# Patient Record
Sex: Male | Born: 1985 | Race: White | Hispanic: No | Marital: Single | Smoking: Never smoker
Health system: Southern US, Community
[De-identification: ages and names within clinical notes are randomized; demographics above are authoritative.]

## PROBLEM LIST (undated history)

## (undated) DIAGNOSIS — F988 Other specified behavioral and emotional disorders with onset usually occurring in childhood and adolescence: Secondary | ICD-10-CM

## (undated) DIAGNOSIS — K219 Gastro-esophageal reflux disease without esophagitis: Secondary | ICD-10-CM

## (undated) DIAGNOSIS — F419 Anxiety disorder, unspecified: Secondary | ICD-10-CM

## (undated) DIAGNOSIS — I10 Essential (primary) hypertension: Secondary | ICD-10-CM

## (undated) HISTORY — DX: Essential (primary) hypertension: I10

## (undated) HISTORY — DX: Gastro-esophageal reflux disease without esophagitis: K21.9

## (undated) HISTORY — DX: Other specified behavioral and emotional disorders with onset usually occurring in childhood and adolescence: F98.8

## (undated) HISTORY — DX: Anxiety disorder, unspecified: F41.9

---
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
## 2012-05-19 ENCOUNTER — Telehealth: Payer: Self-pay | Admitting: Family Medicine

## 2012-05-19 MED ORDER — AMPHETAMINE-DEXTROAMPHET ER 30 MG PO CP24: 30.0000 mg | ORAL_CAPSULE | ORAL | Status: DC

## 2012-05-19 NOTE — Telephone Encounter (Signed)
?   OK to Refill  

## 2012-05-19 NOTE — Telephone Encounter (Signed)
Pt aware to pick up via voicemail °

## 2012-05-19 NOTE — Telephone Encounter (Signed)
Ok to pick up, due for ov.

## 2012-07-01 ENCOUNTER — Telehealth: Payer: Self-pay | Admitting: Family Medicine

## 2012-07-01 NOTE — Telephone Encounter (Signed)
Ok to refill 

## 2012-07-01 NOTE — Telephone Encounter (Signed)
?   OK to Refill  

## 2012-07-02 MED ORDER — AMPHETAMINE-DEXTROAMPHET ER 30 MG PO CP24: 30.0000 mg | ORAL_CAPSULE | ORAL | Status: DC

## 2012-07-02 NOTE — Telephone Encounter (Signed)
..  Rx Refilled and pt aware per vm to pick up rx

## 2012-07-29 ENCOUNTER — Telehealth: Payer: Self-pay | Admitting: Family Medicine

## 2012-07-29 MED ORDER — AMPHETAMINE-DEXTROAMPHET ER 30 MG PO CP24: 30.0000 mg | ORAL_CAPSULE | ORAL | Status: DC

## 2012-07-29 NOTE — Telephone Encounter (Signed)
Per [facility] ok to fill

## 2012-09-01 ENCOUNTER — Telehealth: Payer: Self-pay | Admitting: Family Medicine

## 2012-09-01 NOTE — Telephone Encounter (Signed)
Ok to refill 

## 2012-09-01 NOTE — Telephone Encounter (Signed)
?   OK to Refill  

## 2012-09-02 MED ORDER — AMPHETAMINE-DEXTROAMPHET ER 30 MG PO CP24: 30.0000 mg | ORAL_CAPSULE | ORAL | Status: DC

## 2012-09-02 NOTE — Telephone Encounter (Signed)
rx printed, left up front and pt aware per vm 

## 2012-10-13 ENCOUNTER — Telehealth: Payer: Self-pay | Admitting: Family Medicine

## 2012-10-13 MED ORDER — AMPHETAMINE-DEXTROAMPHET ER 30 MG PO CP24: 30.0000 mg | ORAL_CAPSULE | ORAL | Status: DC

## 2012-10-13 NOTE — Telephone Encounter (Signed)
Adderall XR 30 mg 1 QAM #30  FYI: Pt made an appt for med check for next Tuesday morning.

## 2012-10-13 NOTE — Telephone Encounter (Signed)
?   OK to Refill  

## 2012-10-13 NOTE — Telephone Encounter (Signed)
.  RX printed, left up front and patient aware per vm to pick up

## 2012-10-13 NOTE — Telephone Encounter (Signed)
ok 

## 2012-10-20 ENCOUNTER — Ambulatory Visit: Payer: Self-pay | Admitting: Family Medicine

## 2012-10-27 ENCOUNTER — Encounter: Payer: Self-pay | Admitting: Family Medicine

## 2012-10-27 ENCOUNTER — Ambulatory Visit (INDEPENDENT_AMBULATORY_CARE_PROVIDER_SITE_OTHER): Payer: Managed Care, Other (non HMO) | Admitting: Family Medicine

## 2012-10-27 VITALS — BP 142/78 | HR 74 | Temp 97.9°F | Resp 12 | Ht 72.0 in | Wt 183.0 lb

## 2012-10-27 DIAGNOSIS — F418 Other specified anxiety disorders: Secondary | ICD-10-CM

## 2012-10-27 DIAGNOSIS — F411 Generalized anxiety disorder: Secondary | ICD-10-CM

## 2012-10-27 DIAGNOSIS — F988 Other specified behavioral and emotional disorders with onset usually occurring in childhood and adolescence: Secondary | ICD-10-CM | POA: Insufficient documentation

## 2012-10-27 MED ORDER — AMPHETAMINE-DEXTROAMPHET ER 20 MG PO CP24: 20.0000 mg | ORAL_CAPSULE | Freq: Two times a day (BID) | ORAL | Status: DC

## 2012-10-27 MED ORDER — PROPRANOLOL HCL 20 MG PO TABS: 20.0000 mg | ORAL_TABLET | Freq: Three times a day (TID) | ORAL | Status: DC

## 2012-10-27 NOTE — Progress Notes (Signed)
  Subjective:    Patient ID: Zachary Hopkins, male    DOB: 03-13-1985, 27 y.o.   MRN: [id number]  HPI Patient is a very pleasant 27 year old white male who has a history of ADD. He is currently on Adderall XR 30 mg by mouth every morning. He also uses propranolol 20 mg by mouth x1 as needed for situational anxiety. The patient is currently in law school. Whenever he has to speak in public or prior to test he becomes very anxious. He uses propranolol to help slow his heart rate and calm his anxiety. This works very well for his anxiety. He requests a refill on this medication. He graduates from [employer] in May. Unfortunately he is studying now for his boards. 30 mg of Adderall does not seem to last long enough. He takes it  At 6:30 AM. It wears off 1 or 2 PM.  This does not allow him to study throughout the day. He is interested in possible change in medication to help him study. 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 Past Medical History  Diagnosis Date  . ADD (attention deficit disorder)   . Anxiety    No current outpatient prescriptions on file prior to visit.   No current facility-administered medications on file prior to visit.   No Known Allergies History   Social History  . Marital Status: Single    Spouse Name: N/A    Number of Children: N/A  . Years of Education: N/A   Occupational History  . Not on file.   Social History Main Topics  . Smoking status: Never Smoker   . Smokeless tobacco: Not on file  . Alcohol Use: Yes  . Drug Use: No  . Sexual Activity: Not on file   Other Topics Concern  . Not on file   Social History Narrative  . No narrative on file     Review of Systems  All other systems reviewed and are negative.       Objective:   Physical Exam  Vitals reviewed. Constitutional: He is oriented to person, place, and time. He appears well-developed and well-nourished.  Neck:  Neck supple. No JVD present. No thyromegaly present.  Cardiovascular: Normal rate, regular rhythm and normal heart sounds.   No murmur heard. Pulmonary/Chest: Effort normal and breath sounds normal. No respiratory distress. He has no wheezes. He has no rales.  Abdominal: Soft. Bowel sounds are normal. He exhibits no distension. There is no tenderness. There is no rebound.  Musculoskeletal: He exhibits no edema.  Neurological: He is alert and oriented to person, place, and time. He displays normal reflexes. No cranial nerve deficit. He exhibits normal muscle tone. Coordination normal.  Psychiatric: He has a normal mood and affect. His behavior is normal. Judgment and thought content normal.          Assessment & Plan:  1. ADD (attention deficit disorder) Change medication to Adderall XR 20 mg by mouth twice a day.  Once he passes exam, I would wean back to 20 mg daily as needed  2. Situational anxiety Continue propranolol 20 mg prn.  His blood pressure is elevated today. I rechecked it after he calmed down in the room and it was 122/80.  I asked the patient check his blood pressure frequently at home. If it is consistently elevated I would switch the patient to Inderal LA 60 mg by mouth daily for hypertension.

## 2013-02-01 ENCOUNTER — Telehealth: Payer: Self-pay | Admitting: Family Medicine

## 2013-02-01 MED ORDER — AMPHETAMINE-DEXTROAMPHET ER 20 MG PO CP24: 20.0000 mg | ORAL_CAPSULE | Freq: Two times a day (BID) | ORAL | Status: DC

## 2013-02-01 MED ORDER — PROPRANOLOL HCL 20 MG PO TABS: 20.0000 mg | ORAL_TABLET | Freq: Three times a day (TID) | ORAL | Status: DC

## 2013-02-01 NOTE — Telephone Encounter (Signed)
RX printed, left up front and patient aware to pick up and inderal sent to pharm

## 2013-02-01 NOTE — Telephone Encounter (Signed)
Pt is needing adderall and inderal refilled Call back number [phone]

## 2013-02-01 NOTE — Telephone Encounter (Signed)
ok 

## 2013-02-01 NOTE — Telephone Encounter (Signed)
?   OK to Refill  

## 2013-02-02 NOTE — Telephone Encounter (Signed)
ok 

## 2013-03-25 ENCOUNTER — Telehealth: Payer: Self-pay | Admitting: Family Medicine

## 2013-03-25 NOTE — Telephone Encounter (Signed)
PA sent through [email or web address] - [employer]

## 2013-03-29 NOTE — Telephone Encounter (Signed)
Approved from 03/25/13 - 03/25/14 : Faxed approval to pharm.

## 2013-05-13 ENCOUNTER — Telehealth: Payer: Self-pay | Admitting: *Deleted

## 2013-05-13 NOTE — Telephone Encounter (Signed)
Ok to refill??  Last office visit 10/27/2012.  Last refill 01/27/2013.

## 2013-05-13 NOTE — Telephone Encounter (Signed)
Message copied by Mitchell OdorSIX, Felisa Zechman H on Thu May 13, 2013  4:32 PM ------      Message from: Malvin JohnsBULLINS, SUSAN S      Created: Thu May 13, 2013  4:25 PM       Patient requesting refill on his adderall [phone] ------

## 2013-05-14 MED ORDER — AMPHETAMINE-DEXTROAMPHET ER 20 MG PO CP24: 20.0000 mg | ORAL_CAPSULE | Freq: Two times a day (BID) | ORAL | Status: DC

## 2013-05-14 NOTE — Telephone Encounter (Signed)
Prescription printed.   LMTRC.  

## 2013-05-14 NOTE — Telephone Encounter (Signed)
ok 

## 2013-05-14 NOTE — Telephone Encounter (Signed)
Call placed to patient and patient made aware.  

## 2013-06-25 ENCOUNTER — Telehealth: Payer: Self-pay | Admitting: Family Medicine

## 2013-06-25 DIAGNOSIS — F988 Other specified behavioral and emotional disorders with onset usually occurring in childhood and adolescence: Secondary | ICD-10-CM

## 2013-06-25 MED ORDER — AMPHETAMINE-DEXTROAMPHET ER 30 MG PO CP24: 30.0000 mg | ORAL_CAPSULE | ORAL | Status: DC

## 2013-06-25 NOTE — Telephone Encounter (Signed)
Switch to adderall xr 30 mg poqam.  30

## 2013-06-25 NOTE — Telephone Encounter (Signed)
Wants to know if can switch back to Adderall XR 30 mg once daily.  Will be easier for him to take then 20 mg BID.  Also has made appt to come see you next week.

## 2013-06-25 NOTE — Telephone Encounter (Signed)
Patient needs refill on adderall  [phone] °

## 2013-06-25 NOTE — Telephone Encounter (Signed)
Rx printed and pt aware ready for pickup 

## 2013-06-25 NOTE — Telephone Encounter (Signed)
?   OK to Refill  

## 2013-06-29 ENCOUNTER — Ambulatory Visit: Payer: Self-pay | Admitting: Family Medicine

## 2013-08-02 ENCOUNTER — Telehealth: Payer: Self-pay | Admitting: Family Medicine

## 2013-08-02 DIAGNOSIS — F988 Other specified behavioral and emotional disorders with onset usually occurring in childhood and adolescence: Secondary | ICD-10-CM

## 2013-08-02 NOTE — Telephone Encounter (Signed)
?   OK to Refill  

## 2013-08-02 NOTE — Telephone Encounter (Signed)
Patient needs refill on adderall  [phone]

## 2013-08-03 MED ORDER — AMPHETAMINE-DEXTROAMPHET ER 30 MG PO CP24: 30.0000 mg | ORAL_CAPSULE | ORAL | Status: DC

## 2013-08-03 NOTE — Telephone Encounter (Signed)
ok 

## 2013-08-03 NOTE — Telephone Encounter (Signed)
Prescription printed and patient made aware to come to office to pick up.  

## 2013-10-22 ENCOUNTER — Telehealth: Payer: Self-pay | Admitting: Family Medicine

## 2013-10-22 DIAGNOSIS — F988 Other specified behavioral and emotional disorders with onset usually occurring in childhood and adolescence: Secondary | ICD-10-CM

## 2013-10-22 MED ORDER — AMPHETAMINE-DEXTROAMPHET ER 30 MG PO CP24: 30.0000 mg | ORAL_CAPSULE | ORAL | Status: DC

## 2013-10-22 MED ORDER — PROPRANOLOL HCL 20 MG PO TABS: 20.0000 mg | ORAL_TABLET | Freq: Three times a day (TID) | ORAL | Status: DC

## 2013-10-22 NOTE — Telephone Encounter (Signed)
RX printed, left up front and patient aware to pick up  

## 2013-10-22 NOTE — Telephone Encounter (Signed)
[phone]  PT is needing a refill on  amphetamine-dextroamphetamine (ADDERALL XR) 30 MG 24 hr capsule propranolol (INDERAL) 20 MG tablet

## 2013-10-22 NOTE — Telephone Encounter (Signed)
?   OK to Refill  

## 2013-10-22 NOTE — Telephone Encounter (Signed)
ok 

## 2013-11-25 ENCOUNTER — Telehealth: Payer: Self-pay | Admitting: Family Medicine

## 2013-11-25 DIAGNOSIS — F988 Other specified behavioral and emotional disorders with onset usually occurring in childhood and adolescence: Secondary | ICD-10-CM

## 2013-11-25 MED ORDER — AMPHETAMINE-DEXTROAMPHET ER 30 MG PO CP24: 30.0000 mg | ORAL_CAPSULE | ORAL | Status: DC

## 2013-11-25 NOTE — Telephone Encounter (Signed)
ok 

## 2013-11-25 NOTE — Telephone Encounter (Signed)
?   OK to Refill  

## 2013-11-25 NOTE — Telephone Encounter (Signed)
patinet needs refill on adderall  [phone]

## 2013-11-25 NOTE — Telephone Encounter (Signed)
Prescription printed.   LMTRC.  

## 2013-11-26 NOTE — Telephone Encounter (Signed)
Pt picked up rx

## 2014-01-05 ENCOUNTER — Telehealth: Payer: Self-pay | Admitting: Family Medicine

## 2014-01-05 DIAGNOSIS — F988 Other specified behavioral and emotional disorders with onset usually occurring in childhood and adolescence: Secondary | ICD-10-CM

## 2014-01-05 NOTE — Telephone Encounter (Signed)
?   OK to Refill  

## 2014-01-05 NOTE — Telephone Encounter (Signed)
Patient is requesting refill for adderall  [phone]

## 2014-01-06 NOTE — Telephone Encounter (Signed)
[facility] has not been seen in >1 year.  OK with 1 additional refill to allow him to schedule appt.

## 2014-01-07 MED ORDER — AMPHETAMINE-DEXTROAMPHET ER 30 MG PO CP24: 30.0000 mg | ORAL_CAPSULE | ORAL | Status: DC

## 2014-01-07 NOTE — Telephone Encounter (Signed)
RX printed, left up front and patient aware to pick up and appt made 

## 2014-01-17 ENCOUNTER — Ambulatory Visit (INDEPENDENT_AMBULATORY_CARE_PROVIDER_SITE_OTHER): Payer: BC Managed Care – PPO | Admitting: Family Medicine

## 2014-01-17 ENCOUNTER — Encounter: Payer: Self-pay | Admitting: Family Medicine

## 2014-01-17 VITALS — BP 128/72 | HR 60 | Temp 98.1°F | Resp 14 | Ht 72.0 in | Wt 185.0 lb

## 2014-01-17 DIAGNOSIS — F988 Other specified behavioral and emotional disorders with onset usually occurring in childhood and adolescence: Secondary | ICD-10-CM

## 2014-01-17 DIAGNOSIS — F909 Attention-deficit hyperactivity disorder, unspecified type: Secondary | ICD-10-CM

## 2014-01-17 NOTE — Progress Notes (Signed)
   Subjective:    Patient ID: Zachary Hopkins, male    DOB: 1985-05-11, 28 y.o.   MRN: [id number]  HPI  Patient is doing very well on Adderall XR 30 mg by mouth every morning.  He denies any palpitations or chest pain insomnia or weight loss. He uses the medication approximately 3 days a week. 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 He states that the medication typically wears off around 1:00 in the afternoon. However he is not interested in changing the medication at this time  Past Medical History  Diagnosis Date  . ADD (attention deficit disorder)   . Anxiety     takes propranolol priro to public speaking   No past surgical history on file. Current Outpatient Prescriptions on File Prior to Visit  Medication Sig Dispense Refill  . amphetamine-dextroamphetamine (ADDERALL XR) 30 MG 24 hr capsule Take 1 capsule (30 mg total) by mouth every morning. 30 capsule 0  . propranolol (INDERAL) 20 MG tablet Take 1 tablet (20 mg total) by mouth 3 (three) times daily. As needed for public speaking 90 tablet 1   No current facility-administered medications on file prior to visit.   No Known Allergies History   Social History  . Marital Status: Single    Spouse Name: N/A    Number of Children: N/A  . Years of Education: N/A   Occupational History  . Not on file.   Social History Main Topics  . Smoking status: Never Smoker   . Smokeless tobacco: Not on file  . Alcohol Use: Yes  . Drug Use: No  . Sexual Activity: Not on file   Other Topics Concern  . Not on file   Social History Narrative   No family history on file.   Review of Systems  All other systems reviewed and are negative.      Objective:   Physical Exam  Constitutional: He appears well-developed and well-nourished.  Neck: Neck supple. No thyromegaly present.  Cardiovascular: Normal rate, regular rhythm and normal heart sounds.   No murmur heard. Pulmonary/Chest: Effort normal and breath sounds  normal. No respiratory distress. He has no wheezes. He has no rales.  Psychiatric: He has a normal mood and affect. His behavior is normal. Judgment and thought content normal.  Vitals reviewed.         Assessment & Plan:  ADD (attention deficit disorder) continue Adderall XR 30 mg by mouth every morning. Consider supplementing with Strattera in the future.

## 2014-02-11 ENCOUNTER — Telehealth: Payer: Self-pay | Admitting: Family Medicine

## 2014-02-11 DIAGNOSIS — F988 Other specified behavioral and emotional disorders with onset usually occurring in childhood and adolescence: Secondary | ICD-10-CM

## 2014-02-11 NOTE — Telephone Encounter (Signed)
Patient would like rx for his adderall  [phone]

## 2014-02-11 NOTE — Telephone Encounter (Signed)
?   OK to Refill and to do 3 months?? 

## 2014-02-14 MED ORDER — AMPHETAMINE-DEXTROAMPHET ER 30 MG PO CP24: 30.0000 mg | ORAL_CAPSULE | ORAL | Status: DC

## 2014-02-14 NOTE — Telephone Encounter (Signed)
ok 

## 2014-02-14 NOTE — Telephone Encounter (Signed)
RX printed x 3 months, left up front and patient aware to pick up via vm 

## 2014-03-29 ENCOUNTER — Telehealth: Payer: Self-pay | Admitting: *Deleted

## 2014-03-29 NOTE — Telephone Encounter (Signed)
Received request from pharmacy for PA on Adderall.   PA submitted.   Dx: F90.9- ADD

## 2014-06-03 ENCOUNTER — Telehealth: Payer: Self-pay | Admitting: Family Medicine

## 2014-06-03 DIAGNOSIS — F988 Other specified behavioral and emotional disorders with onset usually occurring in childhood and adolescence: Secondary | ICD-10-CM

## 2014-06-03 NOTE — Telephone Encounter (Signed)
[phone] PT is needing a refill on amphetamine-dextroamphetamine (ADDERALL XR) 30 MG 24 hr capsule

## 2014-06-03 NOTE — Telephone Encounter (Signed)
?   OK to Refill  

## 2014-06-06 MED ORDER — AMPHETAMINE-DEXTROAMPHET ER 30 MG PO CP24: 30.0000 mg | ORAL_CAPSULE | ORAL | Status: DC

## 2014-06-06 NOTE — Telephone Encounter (Signed)
RX printed x 3 months, left up front and patient aware to pick up via vm 

## 2014-06-06 NOTE — Telephone Encounter (Signed)
ok 

## 2014-06-10 ENCOUNTER — Telehealth: Payer: Self-pay | Admitting: *Deleted

## 2014-06-10 NOTE — Telephone Encounter (Signed)
Received request from pharmacy for PA on Adderall.   Call placed to pharmacy. Advised that previous PA is approved for name brand only.   Call placed to patient and patient made aware.

## 2014-10-11 ENCOUNTER — Telehealth: Payer: Self-pay | Admitting: Family Medicine

## 2014-10-11 ENCOUNTER — Encounter: Payer: Self-pay | Admitting: Family Medicine

## 2014-10-11 DIAGNOSIS — F988 Other specified behavioral and emotional disorders with onset usually occurring in childhood and adolescence: Secondary | ICD-10-CM

## 2014-10-11 MED ORDER — PROPRANOLOL HCL 20 MG PO TABS: 20.0000 mg | ORAL_TABLET | Freq: Three times a day (TID) | ORAL | Status: DC

## 2014-10-11 MED ORDER — AMPHETAMINE-DEXTROAMPHET ER 30 MG PO CP24: 30.0000 mg | ORAL_CAPSULE | ORAL | Status: DC

## 2014-10-11 NOTE — Addendum Note (Signed)
Addended by: Legrand Rams B on: 10/11/2014 03:13 PM   Modules accepted: Orders

## 2014-10-11 NOTE — Telephone Encounter (Signed)
Patient called requesting refill on his  amphetamine-dextroamphetamine (ADDERALL XR) 30 MG and  propranolol (INDERAL) 20 MG tablet

## 2014-10-11 NOTE — Telephone Encounter (Signed)
This encounter was created in error - please disregard.

## 2014-10-11 NOTE — Telephone Encounter (Signed)
ok 

## 2014-10-11 NOTE — Telephone Encounter (Signed)
?   OK to Refill  

## 2014-10-11 NOTE — Telephone Encounter (Signed)
RX printed, left up front and patient aware to pick up  

## 2014-11-28 ENCOUNTER — Telehealth: Payer: Self-pay | Admitting: Family Medicine

## 2014-11-28 NOTE — Telephone Encounter (Signed)
PHARMACY: [facility]   MEDICATION: PROPRANOLOL   QTY:    SIG:    PHYSICIAN: PICKARD   PT. PHONE #: [phone]

## 2014-11-29 MED ORDER — PROPRANOLOL HCL 20 MG PO TABS: 20.0000 mg | ORAL_TABLET | Freq: Three times a day (TID) | ORAL | Status: DC

## 2014-11-29 NOTE — Telephone Encounter (Signed)
Medication called/sent to requested pharmacy  

## 2015-01-31 ENCOUNTER — Telehealth: Payer: Self-pay | Admitting: Family Medicine

## 2015-01-31 DIAGNOSIS — F988 Other specified behavioral and emotional disorders with onset usually occurring in childhood and adolescence: Secondary | ICD-10-CM

## 2015-01-31 NOTE — Telephone Encounter (Signed)
?   OK to Refill  

## 2015-01-31 NOTE — Telephone Encounter (Signed)
Pt is calling for a refill of Adderall.  [phone]

## 2015-01-31 NOTE — Telephone Encounter (Signed)
ok 

## 2015-02-01 MED ORDER — AMPHETAMINE-DEXTROAMPHET ER 30 MG PO CP24: 30.0000 mg | ORAL_CAPSULE | ORAL | Status: DC

## 2015-02-01 NOTE — Telephone Encounter (Signed)
RX printed, left up front and patient aware to pick up - pt also has not been seen in a year will need ov - pt is aware of this and will call back to set up appt

## 2015-02-24 ENCOUNTER — Ambulatory Visit (INDEPENDENT_AMBULATORY_CARE_PROVIDER_SITE_OTHER): Payer: 59 | Admitting: Family Medicine

## 2015-02-24 ENCOUNTER — Encounter: Payer: Self-pay | Admitting: Family Medicine

## 2015-02-24 VITALS — BP 152/78 | HR 60 | Temp 98.2°F | Resp 14 | Ht 72.0 in | Wt 187.0 lb

## 2015-02-24 DIAGNOSIS — I10 Essential (primary) hypertension: Secondary | ICD-10-CM | POA: Diagnosis not present

## 2015-02-24 DIAGNOSIS — M6248 Contracture of muscle, other site: Secondary | ICD-10-CM

## 2015-02-24 DIAGNOSIS — M549 Dorsalgia, unspecified: Secondary | ICD-10-CM | POA: Diagnosis not present

## 2015-02-24 DIAGNOSIS — M62838 Other muscle spasm: Secondary | ICD-10-CM

## 2015-02-27 ENCOUNTER — Other Ambulatory Visit: Payer: 59

## 2015-02-27 ENCOUNTER — Other Ambulatory Visit: Payer: Self-pay | Admitting: Family Medicine

## 2015-02-27 ENCOUNTER — Encounter: Payer: Self-pay | Admitting: Family Medicine

## 2015-02-27 ENCOUNTER — Ambulatory Visit
Admission: RE | Admit: 2015-02-27 | Discharge: 2015-02-27 | Disposition: A | Discharge status: Home / Self Care | Payer: Commercial Managed Care - HMO | Source: Ambulatory Visit | Attending: Family Medicine | Admitting: Family Medicine

## 2015-02-27 DIAGNOSIS — M62838 Other muscle spasm: Secondary | ICD-10-CM

## 2015-02-27 DIAGNOSIS — M549 Dorsalgia, unspecified: Secondary | ICD-10-CM

## 2015-02-27 LAB — COMPLETE METABOLIC PANEL WITH GFR
ALK PHOS: 52 U/L (ref 40–115)
ALT: 22 U/L (ref 9–46)
AST: 16 U/L (ref 10–40)
Albumin: 4.4 g/dL (ref 3.6–5.1)
BUN: 27 mg/dL — ABNORMAL HIGH (ref 7–25)
CO2: 27 mmol/L (ref 20–31)
Calcium: 9.3 mg/dL (ref 8.6–10.3)
Chloride: 102 mmol/L (ref 98–110)
Creat: 0.96 mg/dL (ref 0.60–1.35)
GFR, Est African American: >89 mL/min (ref >=60)
GLUCOSE: 97 mg/dL (ref 70–99)
POTASSIUM: 4.3 mmol/L (ref 3.5–5.3)
SODIUM: 140 mmol/L (ref 135–146)
Total Bilirubin: 0.6 mg/dL (ref 0.2–1.2)
Total Protein: 6.3 g/dL (ref 6.1–8.1)

## 2015-02-27 LAB — CBC WITH DIFFERENTIAL/PLATELET
BASOS PCT: 0 % (ref 0–1)
Basophils Absolute: 0 10*3/uL (ref 0.0–0.1)
EOS ABS: 0.1 10*3/uL (ref 0.0–0.7)
EOS PCT: 2 % (ref 0–5)
HCT: 44.5 % (ref 39.0–52.0)
Hemoglobin: 14.7 g/dL (ref 13.0–17.0)
Lymphocytes Relative: 41 % (ref 12–46)
Lymphs Abs: 2.6 10*3/uL (ref 0.7–4.0)
MCH: 29.6 pg (ref 26.0–34.0)
MCHC: 33 g/dL (ref 30.0–36.0)
MCV: 89.7 fL (ref 78.0–100.0)
MONO ABS: 0.4 10*3/uL (ref 0.1–1.0)
MONOS PCT: 7 % (ref 3–12)
MPV: 9.1 fL (ref 8.6–12.4)
Neutro Abs: 3.2 10*3/uL (ref 1.7–7.7)
Neutrophils Relative %: 50 % (ref 43–77)
PLATELETS: 270 10*3/uL (ref 150–400)
RBC: 4.96 MIL/uL (ref 4.22–5.81)
RDW: 14 % (ref 11.5–15.5)
WBC: 6.3 10*3/uL (ref 4.0–10.5)

## 2015-02-27 LAB — MAGNESIUM: Magnesium: 1.9 mg/dL (ref 1.5–2.5)

## 2015-02-27 NOTE — Progress Notes (Signed)
Subjective:    Patient ID: Zachary Hopkins, male    DOB: [birth year], 30 y.o.   MRN: [id number]  HPI  Patient reports pain and muscle spasm in the posterior aspect of his left shoulder. Spasms are located above the spine of the scapula as well as below the spine of the scapula. He also reports muscle tightness and tenderness in the cervical paraspinal muscles in the left side.Marland Kitchen He also reports occasional numbness and tingling radiating into his axillary area in his medial biceps. He is tried massages at home with no benefit. On examination today he has full range of motion in the shoulder without pain. He has a negative empty can sign. He has a negative Hawkins maneuver. He does have pain with O'Brien's test. He has a negative Spurling's maneuver. He also reports pain in the middle of his back around the 10th through 12th thoracic vertebrae. It hurts with rotational motion of the trunk. This is been somewhat chronic. Patient seems very anxious. I believe he is worried that these pains could represent a serious underlying medical problem. His blood pressure is significantly elevated today at 152/78. I personally checked this in both arms and recorded roughly the same value in each arm. He was 150/80 in the right arm and 152/78 in the left arm. His blood pressure cuff at home has been giving him very different values in each arm. However I personally checked her blood pressures today and the blood pressure cuff is inaccurate. Past Medical History  Diagnosis Date  . ADD (attention deficit disorder)   . Anxiety     takes propranolol priro to public speaking   No past surgical history on file. Current Outpatient Prescriptions on File Prior to Visit  Medication Sig Dispense Refill  . amphetamine-dextroamphetamine (ADDERALL XR) 30 MG 24 hr capsule Take 1 capsule (30 mg total) by mouth every morning. 30 capsule 0  . propranolol (INDERAL) 20 MG tablet Take 1 tablet (20 mg total) by mouth 3 (three) times  daily. As needed for public speaking 90 tablet 11   No current facility-administered medications on file prior to visit.   No Known Allergies Social History   Social History  . Marital Status: Single    Spouse Name: N/A  . Number of Children: N/A  . Years of Education: N/A   Occupational History  . Not on file.   Social History Main Topics  . Smoking status: Never Smoker   . Smokeless tobacco: Not on file  . Alcohol Use: Yes  . Drug Use: No  . Sexual Activity: Not on file   Other Topics Concern  . Not on file   Social History Narrative      Review of Systems  All other systems reviewed and are negative.      Objective:   Physical Exam  Constitutional: He is oriented to person, place, and time. He appears well-developed and well-nourished. No distress.  Neck: Normal range of motion. Neck supple.  Cardiovascular: Normal rate, regular rhythm and normal heart sounds.   No murmur heard. Pulmonary/Chest: Effort normal and breath sounds normal. No respiratory distress. He has no wheezes. He has no rales.  Abdominal: Soft. Bowel sounds are normal. He exhibits no distension. There is no tenderness. There is no rebound and no guarding.  Musculoskeletal:       Left shoulder: He exhibits tenderness and spasm. He exhibits normal range of motion, no bony tenderness, no crepitus, no deformity and normal strength.  Cervical back: He exhibits tenderness and spasm.       Thoracic back: He exhibits tenderness, bony tenderness and pain.  Lymphadenopathy:    He has no cervical adenopathy.  Neurological: He is alert and oriented to person, place, and time. He has normal reflexes. He displays normal reflexes. No cranial nerve deficit. He exhibits normal muscle tone. Coordination normal.  Skin: He is not diaphoretic.  Psychiatric: His mood appears anxious.          Assessment & Plan:  Muscle spasms of head and/or neck - Plan: CBC with Differential/Platelet, COMPLETE METABOLIC  PANEL WITH GFR, Magnesium, DG Cervical Spine Complete, DG Shoulder Left  Mid back pain - Plan: DG Thoracic Spine W/Swimmers  Benign essential HTN  I believe the majority of the patient's problem are muscle spasms in the left posterior shoulder and cervical spine. I believe physical therapy would be the most appropriate treatment for this. However I believe the patient's anxiety regarding whether or not this could represent some serious underlying medical problems is making all the symptoms worse. Therefore I will obtain x-rays of the neck thoracic spine and left shoulder to rule out serious medical problems such as skeletal malignancies although I explained to the patient this is highly unlikely. I will also check a CBC to monitor for any evidence of leukemia. I will check a CMP and magnesium level to rule out electrolyte abnormalities as a potential cause of muscle spasms. If the lab work and x-rays are essentially normal, I will recommend formal physical therapy for his symptoms. Also wants another results of his lab work I will start the patient on an antihypertensive medicine. I believe the patient's blood pressure is most likely due to anxiety coupled with an underlying past medical history of borderline hypertension. However given his age and a significant elevation of believe this needs to be treated. I will also obtain renal ultrasound and renal artery ultrasound to rule out secondary causes of hypertension. However I'll await the results of his x-ray and lab work first

## 2015-02-28 ENCOUNTER — Other Ambulatory Visit: Payer: Self-pay | Admitting: Family Medicine

## 2015-02-28 DIAGNOSIS — I10 Essential (primary) hypertension: Secondary | ICD-10-CM

## 2015-03-03 ENCOUNTER — Encounter: Payer: Self-pay | Admitting: Family Medicine

## 2015-03-03 ENCOUNTER — Ambulatory Visit (INDEPENDENT_AMBULATORY_CARE_PROVIDER_SITE_OTHER): Payer: 59 | Admitting: Family Medicine

## 2015-03-03 VITALS — BP 162/90 | HR 60 | Temp 97.9°F | Resp 16 | Ht 72.0 in | Wt 187.0 lb

## 2015-03-03 DIAGNOSIS — M6248 Contracture of muscle, other site: Secondary | ICD-10-CM | POA: Diagnosis not present

## 2015-03-03 DIAGNOSIS — I10 Essential (primary) hypertension: Secondary | ICD-10-CM | POA: Diagnosis not present

## 2015-03-03 DIAGNOSIS — M549 Dorsalgia, unspecified: Secondary | ICD-10-CM | POA: Diagnosis not present

## 2015-03-03 DIAGNOSIS — M62838 Other muscle spasm: Secondary | ICD-10-CM

## 2015-03-03 MED ORDER — LOSARTAN POTASSIUM 50 MG PO TABS: 50.0000 mg | ORAL_TABLET | Freq: Every day | ORAL | Status: DC

## 2015-03-03 NOTE — Progress Notes (Signed)
Subjective:    Patient ID: Zachary Hopkins, male    DOB: [birth year], 30 y.o.   MRN: [id number]  HPI 02/24/15  Patient reports pain and muscle spasm in the posterior aspect of his left shoulder. Spasms are located above the spine of the scapula as well as below the spine of the scapula. He also reports muscle tightness and tenderness in the cervical paraspinal muscles in the left side.Marland Kitchen He also reports occasional numbness and tingling radiating into his axillary area in his medial biceps. He is tried massages at home with no benefit. On examination today he has full range of motion in the shoulder without pain. He has a negative empty can sign. He has a negative Hawkins maneuver. He does have pain with O'Brien's test. He has a negative Spurling's maneuver. He also reports pain in the middle of his back around the 10th through 12th thoracic vertebrae. It hurts with rotational motion of the trunk. This is been somewhat chronic. Patient seems very anxious. I believe he is worried that these pains could represent a serious underlying medical problem. His blood pressure is significantly elevated today at 152/78. I personally checked this in both arms and recorded roughly the same value in each arm. He was 150/80 in the right arm and 152/78 in the left arm. His blood pressure cuff at home has been giving him very different values in each arm. However I personally checked her blood pressures today and the blood pressure cuff is inaccurate.  At that time, my plan was: I believe the majority of the patient's problem are muscle spasms in the left posterior shoulder and cervical spine. I believe physical therapy would be the most appropriate treatment for this. However I believe the patient's anxiety regarding whether or not this could represent some serious underlying medical problems is making all the symptoms worse. Therefore I will obtain x-rays of the neck thoracic spine and left shoulder to rule out serious  medical problems such as skeletal malignancies although I explained to the patient this is highly unlikely. I will also check a CBC to monitor for any evidence of leukemia. I will check a CMP and magnesium level to rule out electrolyte abnormalities as a potential cause of muscle spasms. If the lab work and x-rays are essentially normal, I will recommend formal physical therapy for his symptoms. Also wants another results of his lab work I will start the patient on an antihypertensive medicine. I believe the patient's blood pressure is most likely due to anxiety coupled with an underlying past medical history of borderline hypertension. However given his age and a significant elevation of believe this needs to be treated. I will also obtain renal ultrasound and renal artery ultrasound to rule out secondary causes of hypertension. However I'll await the results of his x-ray and lab work first  03/03/15 Labs returned completely normal. X-rays of the cervical spine were normal. X-rays of thoracic spine, reveals slight curvature which may be due to muscle spasms but no significant bony abnormality. X-rays of the shoulder revealed mild degenerative changes in the before meals joint which may be the source of the patient's anterior shoulder pain. His blood pressure remains elevated today. Past Medical History  Diagnosis Date  . ADD (attention deficit disorder)   . Anxiety     takes propranolol priro to public speaking   No past surgical history on file. Current Outpatient Prescriptions on File Prior to Visit  Medication Sig Dispense Refill  . amphetamine-dextroamphetamine (ADDERALL  XR) 30 MG 24 hr capsule Take 1 capsule (30 mg total) by mouth every morning. 30 capsule 0  . propranolol (INDERAL) 20 MG tablet Take 1 tablet (20 mg total) by mouth 3 (three) times daily. As needed for public speaking 90 tablet 11   No current facility-administered medications on file prior to visit.   No Known  Allergies Social History   Social History  . Marital Status: Single    Spouse Name: N/A  . Number of Children: N/A  . Years of Education: N/A   Occupational History  . Not on file.   Social History Main Topics  . Smoking status: Never Smoker   . Smokeless tobacco: Not on file  . Alcohol Use: Yes  . Drug Use: No  . Sexual Activity: Not on file   Other Topics Concern  . Not on file   Social History Narrative      Review of Systems  All other systems reviewed and are negative.      Objective:   Physical Exam  Constitutional: He is oriented to person, place, and time. He appears well-developed and well-nourished. No distress.  Neck: Normal range of motion. Neck supple.  Cardiovascular: Normal rate, regular rhythm and normal heart sounds.   No murmur heard. Pulmonary/Chest: Effort normal and breath sounds normal. No respiratory distress. He has no wheezes. He has no rales.  Abdominal: Soft. Bowel sounds are normal. He exhibits no distension. There is no tenderness. There is no rebound and no guarding.  Musculoskeletal:       Left shoulder: He exhibits tenderness and spasm. He exhibits normal range of motion, no bony tenderness, no crepitus, no deformity and normal strength.       Cervical back: He exhibits tenderness and spasm.       Thoracic back: He exhibits tenderness, bony tenderness and pain.  Lymphadenopathy:    He has no cervical adenopathy.  Neurological: He is alert and oriented to person, place, and time. He has normal reflexes. No cranial nerve deficit. He exhibits normal muscle tone. Coordination normal.  Skin: He is not diaphoretic.  Psychiatric: His mood appears anxious.          Assessment & Plan:  Benign essential HTN - Plan: losartan (COZAAR) 50 MG tablet, US Renal Artery Stenosis, US Renal  Muscle spasms of head and/or neck  Mid back pain  Patient feels much better knowing that serious medical problems have been ruled out as a cause of his  recurrent muscle spasms and pain. He would like to pursue physical therapy for the spasms in his shoulder and in his middle back. I will start the patient on losartan 50 mg by mouth daily and recheck blood pressure in one month. Given his relative excellent health and his age, I will evaluate for secondary causes of hypertension by obtaining an ultrasound of the kidneys along with an ultrasound of the renal arteries to rule out renal artery stenosis.

## 2015-03-04 LAB — LIPID PANEL
CHOL/HDL RATIO: 1.5 ratio (ref <=5.0)
CHOLESTEROL: 125 mg/dL (ref 125–200)
HDL: 82 mg/dL (ref >=40)
LDL Cholesterol: 31 mg/dL (ref <130)
Triglycerides: 59 mg/dL (ref <150)
VLDL: 12 mg/dL (ref <30)

## 2015-03-06 ENCOUNTER — Ambulatory Visit: Payer: Self-pay | Admitting: Family Medicine

## 2015-03-06 ENCOUNTER — Telehealth: Payer: Self-pay | Admitting: *Deleted

## 2015-03-06 NOTE — Telephone Encounter (Signed)
Pt has appt scheduled at [facility] on 03/13/15 at 9:40am for Renal Artery Stenosis and 10:30am for [place] of renal, pt is to have only liquids from 12 noon to midnight day before exam and nothing to eat or drink after midnight day of exam, please advise pt these exams will take about 1 1/2 hrs. LMTRC to pt for appt information

## 2015-03-07 ENCOUNTER — Other Ambulatory Visit: Payer: 59

## 2015-03-13 ENCOUNTER — Other Ambulatory Visit: Payer: Commercial Managed Care - HMO

## 2015-03-15 ENCOUNTER — Ambulatory Visit: Payer: Commercial Managed Care - HMO | Attending: Family Medicine

## 2015-03-22 ENCOUNTER — Other Ambulatory Visit: Payer: Commercial Managed Care - HMO

## 2015-04-03 ENCOUNTER — Other Ambulatory Visit: Payer: Commercial Managed Care - HMO

## 2015-05-23 ENCOUNTER — Other Ambulatory Visit: Payer: Self-pay | Admitting: Orthopedic Surgery

## 2015-05-23 DIAGNOSIS — M25512 Pain in left shoulder: Secondary | ICD-10-CM

## 2015-05-29 ENCOUNTER — Ambulatory Visit
Admission: RE | Admit: 2015-05-29 | Discharge: 2015-05-29 | Disposition: A | Discharge status: Home / Self Care | Payer: Commercial Managed Care - HMO | Source: Ambulatory Visit | Attending: Orthopedic Surgery | Admitting: Orthopedic Surgery

## 2015-05-29 DIAGNOSIS — M25512 Pain in left shoulder: Secondary | ICD-10-CM

## 2015-11-23 ENCOUNTER — Ambulatory Visit (INDEPENDENT_AMBULATORY_CARE_PROVIDER_SITE_OTHER): Payer: 59 | Admitting: Family Medicine

## 2015-11-23 ENCOUNTER — Encounter: Payer: Self-pay | Admitting: Family Medicine

## 2015-11-23 VITALS — BP 144/88 | HR 62 | Temp 98.0°F | Resp 14 | Ht 72.0 in | Wt 191.0 lb

## 2015-11-23 DIAGNOSIS — Z23 Encounter for immunization: Secondary | ICD-10-CM

## 2015-11-23 DIAGNOSIS — Z0000 Encounter for general adult medical examination without abnormal findings: Secondary | ICD-10-CM | POA: Diagnosis not present

## 2015-11-23 LAB — CBC WITH DIFFERENTIAL/PLATELET
BASOS PCT: 0 %
Basophils Absolute: 0 cells/uL (ref 0–200)
EOS PCT: 2 %
Eosinophils Absolute: 126 cells/uL (ref 15–500)
HCT: 44.6 % (ref 38.5–50.0)
Hemoglobin: 14.9 g/dL (ref 13.0–17.0)
LYMPHS PCT: 36 %
Lymphs Abs: 2268 cells/uL (ref 850–3900)
MCH: 29.8 pg (ref 27.0–33.0)
MCHC: 33.4 g/dL (ref 32.0–36.0)
MCV: 89.2 fL (ref 80.0–100.0)
MPV: 9.2 fL (ref 7.5–12.5)
Monocytes Absolute: 567 cells/uL (ref 200–950)
Monocytes Relative: 9 %
NEUTROS PCT: 53 %
Neutro Abs: 3339 cells/uL (ref 1500–7800)
PLATELETS: 274 10*3/uL (ref 140–400)
RBC: 5 MIL/uL (ref 4.20–5.80)
RDW: 13.8 % (ref 11.0–15.0)
WBC: 6.3 10*3/uL (ref 3.8–10.8)

## 2015-11-23 LAB — COMPLETE METABOLIC PANEL WITH GFR
ALT: 20 U/L (ref 9–46)
AST: 18 U/L (ref 10–40)
Albumin: 4.8 g/dL (ref 3.6–5.1)
Alkaline Phosphatase: 65 U/L (ref 40–115)
BUN: 24 mg/dL (ref 7–25)
CHLORIDE: 103 mmol/L (ref 98–110)
CO2: 28 mmol/L (ref 20–31)
Calcium: 9.3 mg/dL (ref 8.6–10.3)
Creat: 0.85 mg/dL (ref 0.60–1.35)
GFR, Est African American: >89 mL/min (ref >=60)
GFR, Est Non African American: >89 mL/min (ref >=60)
GLUCOSE: 82 mg/dL (ref 70–99)
POTASSIUM: 4.3 mmol/L (ref 3.5–5.3)
SODIUM: 140 mmol/L (ref 135–146)
Total Bilirubin: 0.7 mg/dL (ref 0.2–1.2)
Total Protein: 7 g/dL (ref 6.1–8.1)

## 2015-11-23 LAB — LIPID PANEL
CHOL/HDL RATIO: 1.6 ratio (ref <=5.0)
CHOLESTEROL: 130 mg/dL (ref 125–200)
HDL: 81 mg/dL (ref >=40)
LDL CALC: 35 mg/dL (ref <130)
Triglycerides: 68 mg/dL (ref <150)
VLDL: 14 mg/dL (ref <30)

## 2015-11-23 LAB — TSH: TSH: 1.52 m[IU]/L (ref 0.40–4.50)

## 2015-11-23 MED ORDER — VALSARTAN 320 MG PO TABS: 320.0000 mg | ORAL_TABLET | Freq: Every day | ORAL | 5 refills | Status: DC

## 2015-11-23 NOTE — Progress Notes (Signed)
Subjective:    Patient ID: Zachary Hopkins, male    DOB: Apr 29, 1985, 30 y.o.   MRN: [id number]  HPI  Patient is very pleasant 30 year old white male who is here today for complete physical exam. He denies any medical concerns. He is due for a flu shot. He is also due for his tetanus shot. His blood pressures elevated today at 144/88. He is taking losartan 50 mg by mouth daily. He is not taking propranolol regularly. 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 Past Medical History:  Diagnosis Date  . ADD (attention deficit disorder)   . Anxiety    takes propranolol priro to public speaking   No past surgical history on file. Current Outpatient Prescriptions on File Prior to Visit  Medication Sig Dispense Refill  . losartan (COZAAR) 50 MG tablet Take 1 tablet (50 mg total) by mouth daily. 90 tablet 3  . propranolol (INDERAL) 20 MG tablet Take 1 tablet (20 mg total) by mouth 3 (three) times daily. As needed for public speaking 90 tablet 11   No current facility-administered medications on file prior to visit.    No Known Allergies Social History   Social History  . Marital status: Single    Spouse name: N/A  . Number of children: N/A  . Years of education: N/A   Occupational History  . Not on file.   Social History Main Topics  . Smoking status: Never Smoker  . Smokeless tobacco: Not on file  . Alcohol use Yes  . Drug use: No  . Sexual activity: Not on file   Other Topics Concern  . Not on file   Social History Narrative  . No narrative on file   No family history on file.    Review of Systems  All other systems reviewed and are negative.      Objective:   Physical Exam  Constitutional: He is oriented to person, place, and time. He appears well-developed and well-nourished. No distress.  HENT:  Head: Normocephalic and atraumatic.  Right Ear: External ear normal.  Left Ear: External ear normal.  Nose: Nose normal.    Mouth/Throat: Oropharynx is clear and moist. No oropharyngeal exudate.  Eyes: Conjunctivae and EOM are normal. Pupils are equal, round, and reactive to light. Right eye exhibits no discharge. Left eye exhibits no discharge. No scleral icterus.  Neck: Normal range of motion. Neck supple. No JVD present. No tracheal deviation present. No thyromegaly present.  Cardiovascular: Normal rate, regular rhythm, normal heart sounds and intact distal pulses.  Exam reveals no gallop and no friction rub.   No murmur heard. Pulmonary/Chest: Effort normal and breath sounds normal. No stridor. No respiratory distress. He has no wheezes. He has no rales. He exhibits no tenderness.  Abdominal: Soft. Bowel sounds are normal. He exhibits no distension and no mass. There is no tenderness. There is no rebound and no guarding.  Musculoskeletal: Normal range of motion. He exhibits no edema, tenderness or deformity.       Left shoulder: He exhibits spasm. He exhibits normal range of motion, no bony tenderness, no crepitus, no deformity and normal strength.       Cervical back: He exhibits spasm.       Thoracic back: He exhibits bony tenderness and pain.  Lymphadenopathy:    He has no cervical adenopathy.  Neurological: He is alert and oriented to person, place, and time. He has normal reflexes.  No cranial nerve deficit. He exhibits normal muscle tone. Coordination normal.  Skin: Skin is warm and dry. No rash noted. He is not diaphoretic. No erythema. No pallor.  Psychiatric: His behavior is normal. Judgment and thought content normal. His mood appears anxious.          Assessment & Plan:  Routine general medical examination at a health care facility - Plan: CBC with Differential/Platelet, COMPLETE METABOLIC PANEL WITH GFR, Lipid panel, TSH  Needs flu shot - Plan: Flu Vaccine QUAD 36+ mos IM  Need for Tdap vaccination - Plan: Tdap vaccine greater than or equal to 7yo IM  Patient's physical exam is completely  normal. Discontinue losartan and switched to valsartan 320 mg by mouth daily. Recheck blood pressure in one month. He received his flu shot. He received his tetanus shot. Check a CBC, CMP, fasting lipid panel, and a TSH

## 2015-11-27 ENCOUNTER — Encounter: Payer: Self-pay | Admitting: Family Medicine

## 2015-12-22 ENCOUNTER — Other Ambulatory Visit: Payer: Self-pay | Admitting: Family Medicine

## 2016-05-30 ENCOUNTER — Encounter: Payer: Self-pay | Admitting: Family Medicine

## 2016-06-05 ENCOUNTER — Other Ambulatory Visit: Payer: Self-pay | Admitting: Family Medicine

## 2016-09-04 IMAGING — MR MR SHOULDER*L* W/O CM
4 of 5 series · 14 of 40 positions shown · non-contrast
Comparison: Shoulder x-ray 02/27/2015

CLINICAL DATA: Left shoulder pain for 9 months

EXAM:
MRI OF THE LEFT SHOULDER WITHOUT CONTRAST
TECHNIQUE: Multiplanar, multisequence MR imaging of the shoulder was performed.
No intravenous contrast was administered.

[Series 6: T2 fat-sat · oblique · left · 3.0mm · 0.44mm/px · 3 of 21 slices shown (1 of 3)]
[im 4/21]
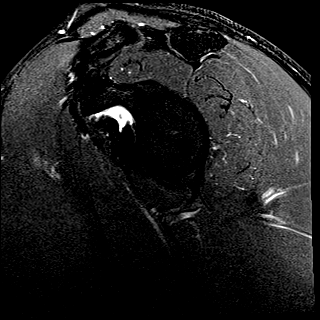
[im 11/21]
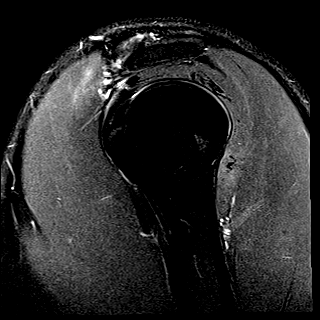
[im 17/21]
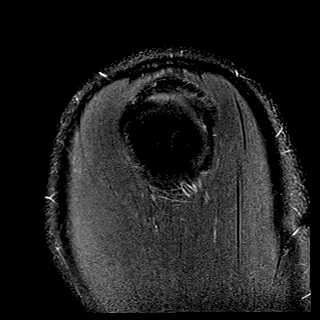

[Series 7: T2 fat-sat · axial · left · 3.0mm · 0.44mm/px · z∈[-44,+23]mm · 3 of 25 slices shown (2 of 3)]
[im 4/25]
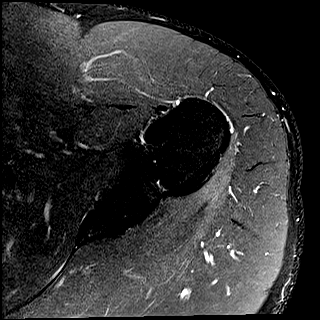
[im 13/25]
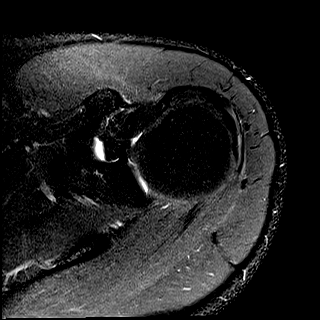
[im 22/25]
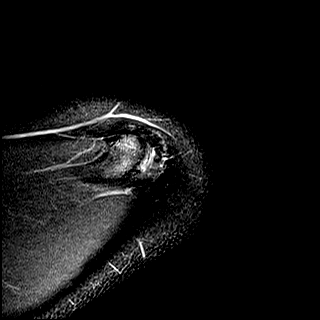

[Series 8: PD · oblique · left · 3.0mm · 0.18mm/px · 5 of 21 slices shown]
[im 1/21]
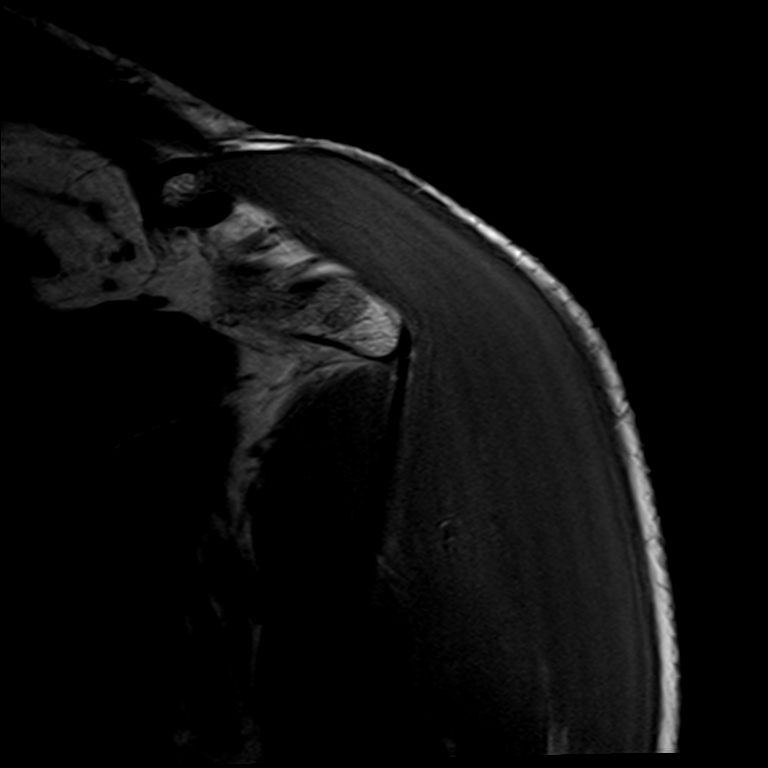
[im 3/21]
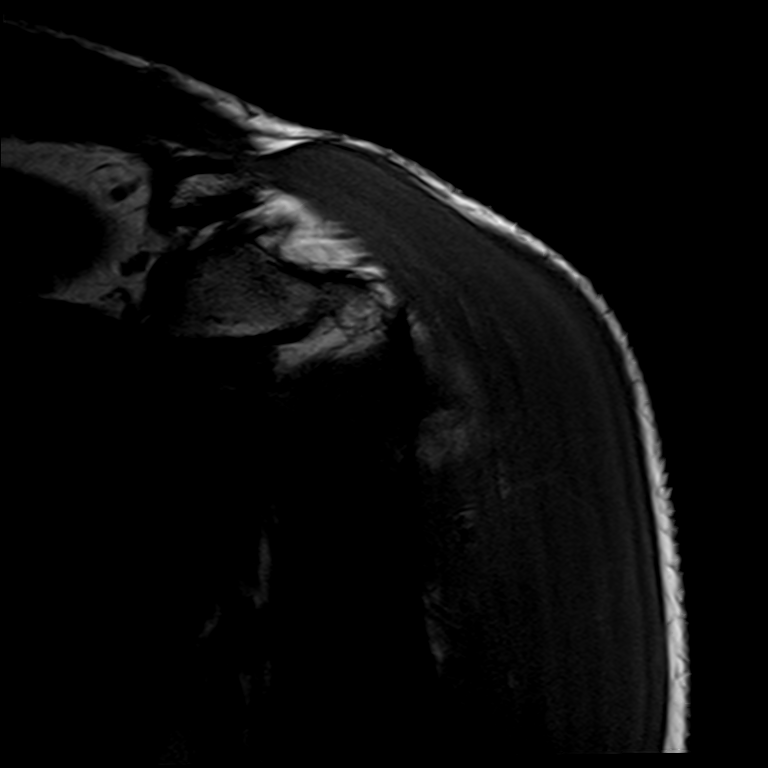
[im 6/21]
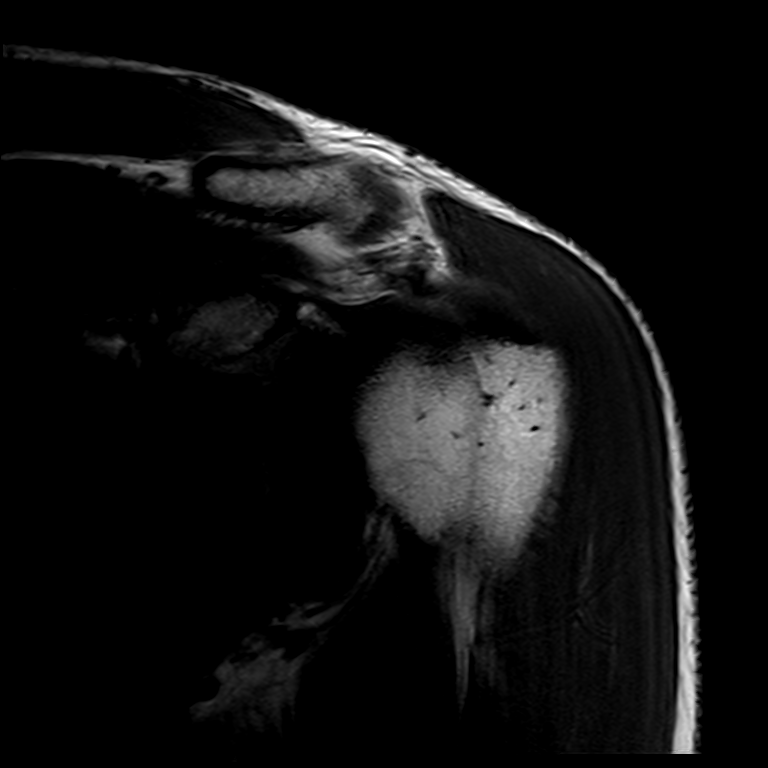
[im 12/21]
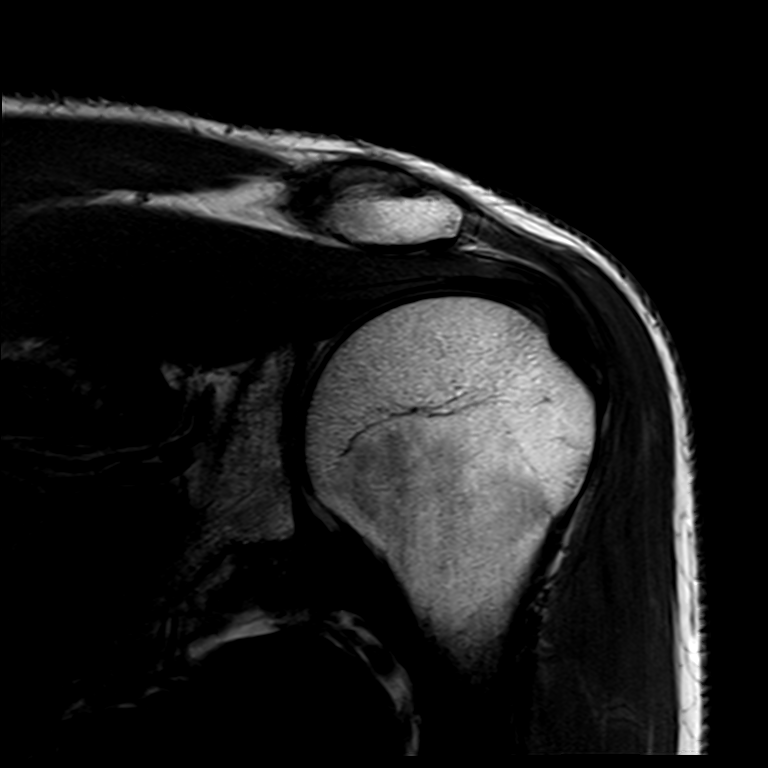
[im 18/21]
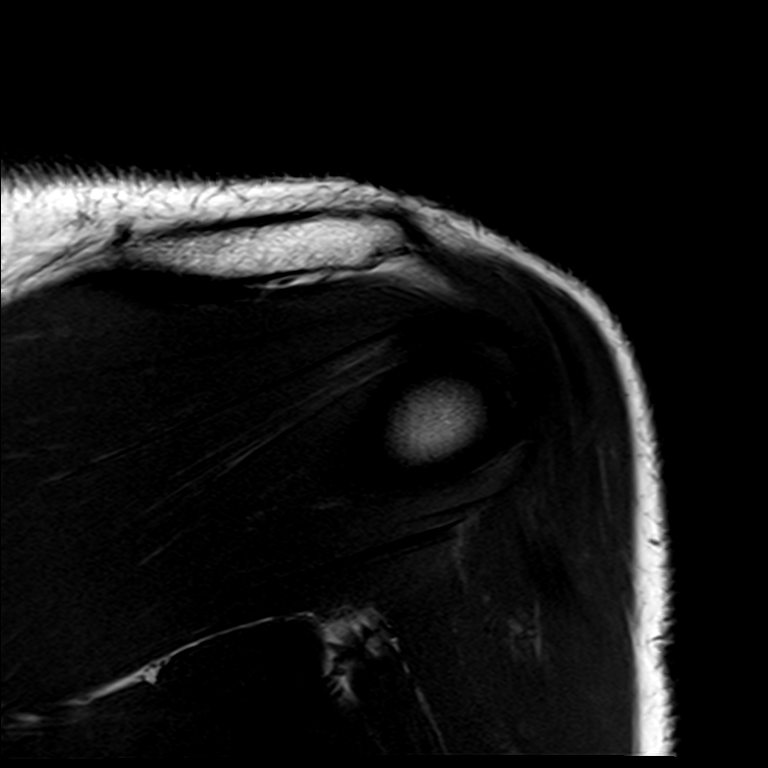

[Series 9: T2 fat-sat · oblique · left · 3.0mm · 0.22mm/px · 3 of 21 slices shown (3 of 3)]
[im 3/21]
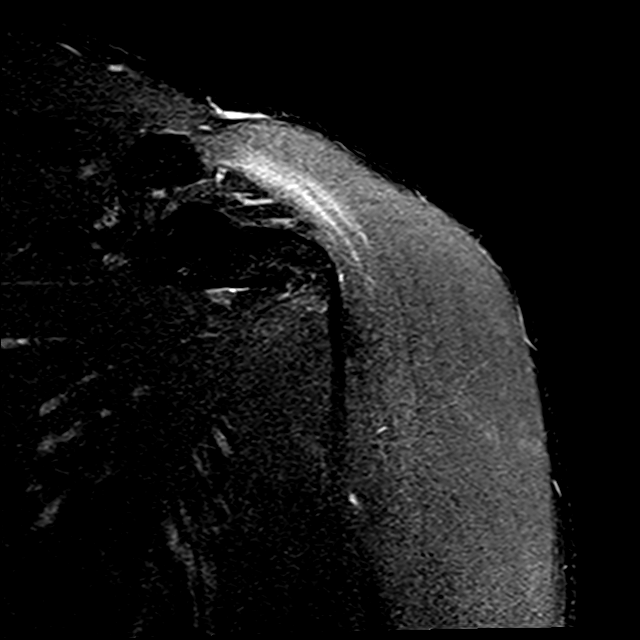
[im 12/21]
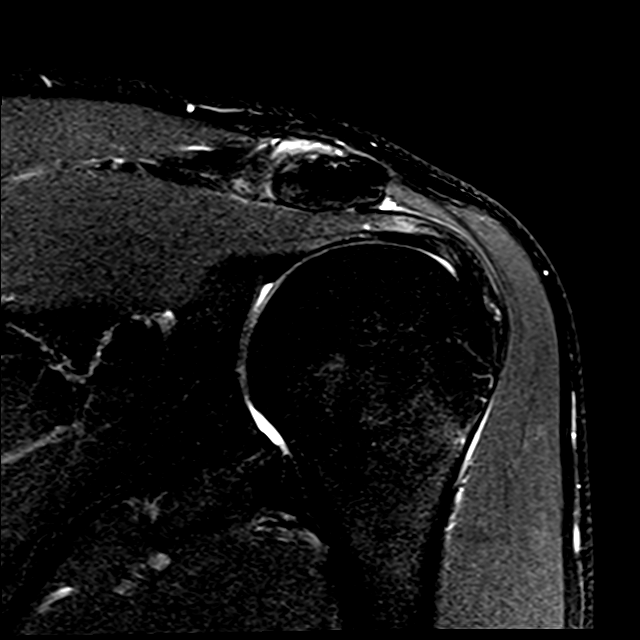
[im 18/21]
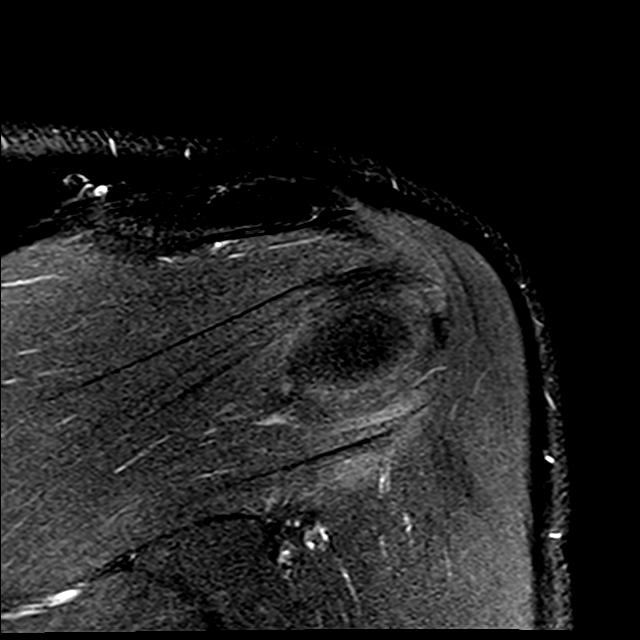

[14 of 40 positions shown; findings below may reference images not displayed]

FINDINGS: Rotator cuff: Mild tendinosis of the junction of the supraspinatus
and infraspinatus tendons without a tear. Teres minor tendon is
intact. Subscapularis tendon is intact.

Muscles: No atrophy or fatty replacement of nor abnormal signal
within, the muscles of the rotator cuff. Mild edema in the superior
most aspect of the anterior deltoid muscle concerning for mild
muscle strain.

Biceps long head:  Intact.

Acromioclavicular Joint: Mild degenerative changes of the
acromioclavicular joint. Type I acromion. No significant
subacromial/ subdeltoid bursal fluid.

Glenohumeral Joint: No joint effusion.  No chondral defect.

Labrum: Grossly intact, but evaluation is limited by lack of
intraarticular fluid.

Bones: No focal marrow signal abnormality. No fracture or
dislocation.
IMPRESSION: 1. Mild tendinosis of the junction of the supraspinatus and
infraspinatus tendons without a tear.
2. Mild degenerative changes of the acromioclavicular joint.
3. Mild muscle strain of the superior most aspect of the anterior
deltoid muscle.

## 2016-09-17 ENCOUNTER — Encounter: Payer: Self-pay | Admitting: Family Medicine

## 2016-09-17 ENCOUNTER — Telehealth: Payer: Self-pay | Admitting: Family Medicine

## 2016-09-17 ENCOUNTER — Other Ambulatory Visit: Payer: Self-pay | Admitting: Family Medicine

## 2016-09-17 ENCOUNTER — Ambulatory Visit (INDEPENDENT_AMBULATORY_CARE_PROVIDER_SITE_OTHER): Payer: BLUE CROSS/BLUE SHIELD | Admitting: Family Medicine

## 2016-09-17 VITALS — BP 128/72 | HR 61 | Temp 98.1°F | Resp 16 | Wt 185.0 lb

## 2016-09-17 DIAGNOSIS — I10 Essential (primary) hypertension: Secondary | ICD-10-CM

## 2016-09-17 MED ORDER — LOSARTAN POTASSIUM 50 MG PO TABS: 50.0000 mg | ORAL_TABLET | Freq: Every day | ORAL | 3 refills | Status: DC

## 2016-09-17 NOTE — Progress Notes (Signed)
   Subjective:    Patient ID: Zachary Hopkins, male    DOB: 09-20-1985, 31 y.o.   MRN: [id number]  HPI Has hypertension.  Was on valsartan 320 mg daily.  Recently discontinue med due to recall.  Off med for 4 days and BP is excellent today.  He denies any chest pain shortness of breath or dyspnea on exertion. Past Medical History:  Diagnosis Date  . ADD (attention deficit disorder)   . Anxiety    takes propranolol priro to public speaking   No past surgical history on file. Current Outpatient Prescriptions on File Prior to Visit  Medication Sig Dispense Refill  . propranolol (INDERAL) 20 MG tablet TAKE 1 TABLET (20 MG TOTAL) BY MOUTH 3 (THREE) TIMES DAILY AS NEEDED FOR PUBLIC SPEAKING 90 tablet 2   No current facility-administered medications on file prior to visit.    No Known Allergies Social History   Social History  . Marital status: Single    Spouse name: N/A  . Number of children: N/A  . Years of education: N/A   Occupational History  . Not on file.   Social History Main Topics  . Smoking status: Never Smoker  . Smokeless tobacco: Never Used  . Alcohol use Yes  . Drug use: No  . Sexual activity: Not on file   Other Topics Concern  . Not on file   Social History Narrative  . No narrative on file      Review of Systems  All other systems reviewed and are negative.      Objective:   Physical Exam  Constitutional: He appears well-developed and well-nourished.  Neck: Neck supple. No thyromegaly present.  Cardiovascular: Normal rate, regular rhythm and normal heart sounds.   No murmur heard. Pulmonary/Chest: Effort normal and breath sounds normal. No respiratory distress. He has no wheezes. He has no rales.  Abdominal: Soft. Bowel sounds are normal. He exhibits no distension. There is no tenderness. There is no rebound and no guarding.  Vitals reviewed.         Assessment & Plan:  Benign essential HTN - Plan: CBC with Differential/Platelet,  COMPLETE METABOLIC PANEL WITH GFR  Blood pressure today is well controlled. I did give the patient a prescription for losartan 50 mg a day. I asked him not to get the prescription unless his blood pressure rises above 140/90. He will start checking his blood pressure regularly at home and notify me of the values. While the patient is here today, I will check a CBC and a CMP

## 2016-09-17 NOTE — Telephone Encounter (Signed)
Valsartan has been recalled.  Pharmacy asking for alternative medication.  Please advise?

## 2016-09-18 LAB — COMPLETE METABOLIC PANEL WITH GFR
ALT: 22 U/L (ref 9–46)
AST: 22 U/L (ref 10–40)
Albumin: 4.6 g/dL (ref 3.6–5.1)
Alkaline Phosphatase: 68 U/L (ref 40–115)
BUN: 22 mg/dL (ref 7–25)
CHLORIDE: 103 mmol/L (ref 98–110)
CO2: 25 mmol/L (ref 20–32)
Calcium: 9.2 mg/dL (ref 8.6–10.3)
Creat: 1.04 mg/dL (ref 0.60–1.35)
GFR, Est African American: >89 mL/min (ref >=60)
GLUCOSE: 90 mg/dL (ref 70–99)
POTASSIUM: 4.1 mmol/L (ref 3.5–5.3)
SODIUM: 139 mmol/L (ref 135–146)
Total Bilirubin: 0.5 mg/dL (ref 0.2–1.2)
Total Protein: 6.5 g/dL (ref 6.1–8.1)

## 2016-09-18 LAB — CBC WITH DIFFERENTIAL/PLATELET
Basophils Absolute: 65 cells/uL (ref 0–200)
Basophils Relative: 1 %
EOS PCT: 1 %
Eosinophils Absolute: 65 cells/uL (ref 15–500)
HCT: 40.6 % (ref 38.5–50.0)
Hemoglobin: 13.5 g/dL (ref 13.0–17.0)
LYMPHS PCT: 31 %
Lymphs Abs: 2015 cells/uL (ref 850–3900)
MCH: 30.1 pg (ref 27.0–33.0)
MCHC: 33.3 g/dL (ref 32.0–36.0)
MCV: 90.4 fL (ref 80.0–100.0)
MONOS PCT: 6 %
MPV: 9.4 fL (ref 7.5–12.5)
Monocytes Absolute: 390 cells/uL (ref 200–950)
NEUTROS PCT: 61 %
Neutro Abs: 3965 cells/uL (ref 1500–7800)
Platelets: 320 10*3/uL (ref 140–400)
RBC: 4.49 MIL/uL (ref 4.20–5.80)
RDW: 13.3 % (ref 11.0–15.0)
WBC: 6.5 10*3/uL (ref 3.8–10.8)

## 2016-09-24 NOTE — Telephone Encounter (Signed)
Provider has sent in new medication

## 2017-08-26 ENCOUNTER — Ambulatory Visit: Payer: BLUE CROSS/BLUE SHIELD | Admitting: Family Medicine

## 2017-09-28 ENCOUNTER — Other Ambulatory Visit: Payer: Self-pay | Admitting: Family Medicine

## 2018-03-26 DIAGNOSIS — M9905 Segmental and somatic dysfunction of pelvic region: Secondary | ICD-10-CM | POA: Diagnosis not present

## 2018-03-26 DIAGNOSIS — M545 Low back pain: Secondary | ICD-10-CM | POA: Diagnosis not present

## 2018-03-26 DIAGNOSIS — M9903 Segmental and somatic dysfunction of lumbar region: Secondary | ICD-10-CM | POA: Diagnosis not present

## 2018-03-31 DIAGNOSIS — M545 Low back pain: Secondary | ICD-10-CM | POA: Diagnosis not present

## 2018-03-31 DIAGNOSIS — M9905 Segmental and somatic dysfunction of pelvic region: Secondary | ICD-10-CM | POA: Diagnosis not present

## 2018-03-31 DIAGNOSIS — M9903 Segmental and somatic dysfunction of lumbar region: Secondary | ICD-10-CM | POA: Diagnosis not present

## 2018-04-02 ENCOUNTER — Encounter: Payer: Self-pay | Admitting: Family Medicine

## 2018-04-02 ENCOUNTER — Ambulatory Visit (INDEPENDENT_AMBULATORY_CARE_PROVIDER_SITE_OTHER): Payer: 59 | Admitting: Family Medicine

## 2018-04-02 VITALS — BP 138/64 | HR 60 | Temp 97.9°F | Resp 16 | Ht 72.0 in | Wt 191.0 lb

## 2018-04-02 DIAGNOSIS — K2100 Gastro-esophageal reflux disease with esophagitis, without bleeding: Secondary | ICD-10-CM

## 2018-04-02 DIAGNOSIS — Z0000 Encounter for general adult medical examination without abnormal findings: Secondary | ICD-10-CM | POA: Diagnosis not present

## 2018-04-02 DIAGNOSIS — Z23 Encounter for immunization: Secondary | ICD-10-CM

## 2018-04-02 LAB — CBC WITH DIFFERENTIAL/PLATELET
Absolute Monocytes: 388 cells/uL (ref 200–950)
BASOS ABS: 31 {cells}/uL (ref 0–200)
Basophils Relative: 0.6 %
EOS PCT: 1.4 %
Eosinophils Absolute: 71 cells/uL (ref 15–500)
HCT: 45.7 % (ref 38.5–50.0)
HEMOGLOBIN: 15.6 g/dL (ref 13.2–17.1)
Lymphs Abs: 2183 cells/uL (ref 850–3900)
MCH: 30.3 pg (ref 27.0–33.0)
MCHC: 34.1 g/dL (ref 32.0–36.0)
MCV: 88.7 fL (ref 80.0–100.0)
MONOS PCT: 7.6 %
MPV: 9.6 fL (ref 7.5–12.5)
NEUTROS ABS: 2428 {cells}/uL (ref 1500–7800)
NEUTROS PCT: 47.6 %
PLATELETS: 304 10*3/uL (ref 140–400)
RBC: 5.15 10*6/uL (ref 4.20–5.80)
RDW: 12.3 % (ref 11.0–15.0)
TOTAL LYMPHOCYTE: 42.8 %
WBC: 5.1 10*3/uL (ref 3.8–10.8)

## 2018-04-02 LAB — COMPLETE METABOLIC PANEL WITH GFR
AG Ratio: 2.3 (calc) (ref 1.0–2.5)
ALKALINE PHOSPHATASE (APISO): 60 U/L (ref 36–130)
ALT: 24 U/L (ref 9–46)
AST: 19 U/L (ref 10–40)
Albumin: 4.8 g/dL (ref 3.6–5.1)
BILIRUBIN TOTAL: 0.9 mg/dL (ref 0.2–1.2)
BUN: 19 mg/dL (ref 7–25)
CHLORIDE: 103 mmol/L (ref 98–110)
CO2: 29 mmol/L (ref 20–32)
CREATININE: 0.93 mg/dL (ref 0.60–1.35)
Calcium: 9.8 mg/dL (ref 8.6–10.3)
GFR, Est African American: 126 mL/min/{1.73_m2} (ref >=60)
GFR, Est Non African American: 109 mL/min/{1.73_m2} (ref >=60)
GLUCOSE: 95 mg/dL (ref 65–99)
Globulin: 2.1 g/dL (calc) (ref 1.9–3.7)
Potassium: 4.4 mmol/L (ref 3.5–5.3)
Sodium: 139 mmol/L (ref 135–146)
Total Protein: 6.9 g/dL (ref 6.1–8.1)

## 2018-04-02 LAB — LIPID PANEL
CHOL/HDL RATIO: 1.8 (calc) (ref <5.0)
CHOLESTEROL: 143 mg/dL (ref <200)
HDL: 79 mg/dL (ref >=40)
LDL Cholesterol (Calc): 51 mg/dL (calc)
Non-HDL Cholesterol (Calc): 64 mg/dL (calc) (ref <130)
Triglycerides: 44 mg/dL (ref <150)

## 2018-04-02 NOTE — Progress Notes (Signed)
Subjective:    Patient ID: Zachary Hopkins, male    DOB: 19-Nov-1985, 33 y.o.   MRN: [id number]  HPI Patient is a very pleasant 33 year old Caucasian male here today for complete physical exam.  Since I last saw the patient, he is changed careers.  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  He seems to be much happier in this.  He is married.  At the present time there are no children although they are planning.  His only medical concern is severe heartburn.  He states that recently he has been dealing with indigestion almost on a daily basis.  He describes it as a "sensation" in his mid esophagus made worse by eating.  He also reports a constant burning discomfort in the center of his chest made worse by food.  Patient had tried famotidine with very little relief.  He is also tried 2 weeks of Nexium with no relief.  He been trying peppermint all with minimal success.  Patient cut caffeine and coffee out of his diet earlier this week and for the last 4 days has been asymptomatic.  He denies any melena or hematochezia.  He denies any odynophagia.  He does report some mild dysphagia however this has improved since discontinuing coffee.  He denies any hematemesis or nausea or vomiting. Past Medical History:  Diagnosis Date  . ADD (attention deficit disorder)   . Anxiety    takes propranolol priro to public speaking    Current Outpatient Medications on File Prior to Visit  Medication Sig Dispense Refill  . losartan (COZAAR) 50 MG tablet TAKE ONE TABLET BY MOUTH DAILY 90 tablet 1  . propranolol (INDERAL) 20 MG tablet TAKE 1 TABLET (20 MG TOTAL) BY MOUTH 3 (THREE) TIMES DAILY AS NEEDED FOR PUBLIC SPEAKING 90 tablet 2   No current facility-administered medications on file prior to visit.    No Known Allergies Social History   Socioeconomic History  . Marital status: Single    Spouse name: Not on file  . Number of children: Not on file  . Years of education: Not on file    . Highest education level: Not on file  Occupational History  . Not on file  Social Needs  . Financial resource strain: Not on file  . Food insecurity:    Worry: Not on file    Inability: Not on file  . Transportation needs:    Medical: Not on file    Non-medical: Not on file  Tobacco Use  . Smoking status: Never Smoker  . Smokeless tobacco: Never Used  Substance and Sexual Activity  . Alcohol use: Yes  . Drug use: No  . Sexual activity: Not on file  Lifestyle  . Physical activity:    Days per week: Not on file    Minutes per session: Not on file  . Stress: Not on file  Relationships  . Social connections:    Talks on phone: Not on file    Gets together: Not on file    Attends religious service: Not on file    Active member of club or organization: Not on file    Attends meetings of clubs or organizations: Not on file    Relationship status: Not on file  . Intimate partner violence:    Fear of current or ex partner: Not on file    Emotionally abused: Not on file    Physically abused: Not on file  Forced sexual activity: Not on file  Other Topics Concern  . Not on file  Social History Narrative  . Not on file   No family history on file.    Review of Systems  All other systems reviewed and are negative.      Objective:   Physical Exam Vitals signs reviewed.  Constitutional:      General: He is not in acute distress.    Appearance: Normal appearance. He is normal weight. He is not ill-appearing, toxic-appearing or diaphoretic.  HENT:     Head: Normocephalic and atraumatic.     Right Ear: Tympanic membrane, ear canal and external ear normal. There is no impacted cerumen.     Left Ear: Tympanic membrane, ear canal and external ear normal. There is no impacted cerumen.     Nose: Nose normal. No congestion or rhinorrhea.     Mouth/Throat:     Mouth: Mucous membranes are moist.     Pharynx: No oropharyngeal exudate or posterior oropharyngeal erythema.   Eyes:     General: No scleral icterus.       Right eye: No discharge.        Left eye: No discharge.     Extraocular Movements: Extraocular movements intact.     Conjunctiva/sclera: Conjunctivae normal.     Pupils: Pupils are equal, round, and reactive to light.  Neck:     Musculoskeletal: Normal range of motion and neck supple. No neck rigidity or muscular tenderness.     Vascular: No carotid bruit.  Cardiovascular:     Rate and Rhythm: Normal rate and regular rhythm.     Pulses: Normal pulses.     Heart sounds: Normal heart sounds. No murmur. No friction rub. No gallop.   Pulmonary:     Effort: Pulmonary effort is normal. No respiratory distress.     Breath sounds: Normal breath sounds. No stridor. No wheezing, rhonchi or rales.  Chest:     Chest wall: No tenderness.  Abdominal:     General: Bowel sounds are normal. There is no distension.     Palpations: Abdomen is soft.     Tenderness: There is no abdominal tenderness. There is no right CVA tenderness, left CVA tenderness, guarding or rebound.     Hernia: No hernia is present.  Musculoskeletal:     Right lower leg: No edema.     Left lower leg: No edema.  Lymphadenopathy:     Cervical: No cervical adenopathy.  Neurological:     General: No focal deficit present.     Mental Status: He is alert and oriented to person, place, and time. Mental status is at baseline.  Psychiatric:        Mood and Affect: Mood normal.        Behavior: Behavior normal.        Thought Content: Thought content normal.        Judgment: Judgment normal.           Assessment & Plan:  General medical exam - Plan: CBC with Differential/Platelet, COMPLETE METABOLIC PANEL WITH GFR, Lipid panel  Gastroesophageal reflux disease with esophagitis - Plan: H. pylori breath test  Physical exam today is completely normal.  Patient will receive his flu shot.  He declines HIV screening due to lack of risk factors.  Blood pressure at home is excellent on  the losartan.  I believe the patient is having acid reflux and likely esophagitis due to this.  I encouraged the patient  to continue his dietary changes and we will also start him on Dexilant 60 mg p.o. every morning and then reassess in 2 weeks.  If symptoms persist, I would recommend a GI consult for EGD.  I will also screen the patient for Helicobacter pylori.  Also check CBC, CMP, fasting lipid panel.

## 2018-04-02 NOTE — Addendum Note (Signed)
Addended by: Legrand Rams B on: 04/02/2018 12:45 PM   Modules accepted: Orders

## 2018-04-03 LAB — H. PYLORI BREATH TEST: H. pylori Breath Test: NOT DETECTED

## 2018-04-06 ENCOUNTER — Telehealth: Payer: Self-pay | Admitting: Family Medicine

## 2018-04-06 ENCOUNTER — Other Ambulatory Visit: Payer: Self-pay | Admitting: Family Medicine

## 2018-04-06 NOTE — Telephone Encounter (Signed)
[facility]  Patient needs refill on his losartan asap if possible

## 2018-04-06 NOTE — Telephone Encounter (Signed)
Has already been sent to [facility]

## 2018-04-08 DIAGNOSIS — M545 Low back pain: Secondary | ICD-10-CM | POA: Diagnosis not present

## 2018-04-08 DIAGNOSIS — M9905 Segmental and somatic dysfunction of pelvic region: Secondary | ICD-10-CM | POA: Diagnosis not present

## 2018-04-08 DIAGNOSIS — M9903 Segmental and somatic dysfunction of lumbar region: Secondary | ICD-10-CM | POA: Diagnosis not present

## 2018-04-14 DIAGNOSIS — M9905 Segmental and somatic dysfunction of pelvic region: Secondary | ICD-10-CM | POA: Diagnosis not present

## 2018-04-14 DIAGNOSIS — M9903 Segmental and somatic dysfunction of lumbar region: Secondary | ICD-10-CM | POA: Diagnosis not present

## 2018-04-14 DIAGNOSIS — M545 Low back pain: Secondary | ICD-10-CM | POA: Diagnosis not present

## 2018-04-17 DIAGNOSIS — M9903 Segmental and somatic dysfunction of lumbar region: Secondary | ICD-10-CM | POA: Diagnosis not present

## 2018-04-17 DIAGNOSIS — M545 Low back pain: Secondary | ICD-10-CM | POA: Diagnosis not present

## 2018-04-17 DIAGNOSIS — M9905 Segmental and somatic dysfunction of pelvic region: Secondary | ICD-10-CM | POA: Diagnosis not present

## 2018-04-21 ENCOUNTER — Telehealth: Payer: Self-pay | Admitting: Family Medicine

## 2018-04-21 DIAGNOSIS — M5136 Other intervertebral disc degeneration, lumbar region: Secondary | ICD-10-CM | POA: Diagnosis not present

## 2018-04-21 NOTE — Telephone Encounter (Signed)
For cost, I would try protonix 40 mg poqday

## 2018-04-21 NOTE — Telephone Encounter (Signed)
Pt called and states that the samples you gave him for heartburn worked very well. He did come off of them just to see if it was heartburn and it come back and would like a prescription called in. Please advise.

## 2018-04-22 MED ORDER — DEXLANSOPRAZOLE 60 MG PO CPDR: 60.0000 mg | DELAYED_RELEASE_CAPSULE | Freq: Every day | ORAL | 5 refills | Status: DC

## 2018-04-22 NOTE — Telephone Encounter (Signed)
Called pt to inform of medication sent in and pt requested the same meds as the sample he was given in office. Dexilant sent to pharm and pt will call if too expensive.

## 2018-06-22 DIAGNOSIS — K219 Gastro-esophageal reflux disease without esophagitis: Secondary | ICD-10-CM | POA: Diagnosis not present

## 2018-10-20 ENCOUNTER — Institutional Professional Consult (permissible substitution): Payer: Commercial Managed Care - HMO | Admitting: Plastic Surgery

## 2019-03-15 ENCOUNTER — Other Ambulatory Visit: Payer: Self-pay

## 2019-03-15 ENCOUNTER — Ambulatory Visit (INDEPENDENT_AMBULATORY_CARE_PROVIDER_SITE_OTHER): Payer: 59 | Admitting: Nurse Practitioner

## 2019-03-15 ENCOUNTER — Encounter: Payer: Self-pay | Admitting: Nurse Practitioner

## 2019-03-15 VITALS — BP 124/80 | HR 63 | Temp 97.6°F | Resp 16 | Ht 72.0 in | Wt 197.5 lb

## 2019-03-15 DIAGNOSIS — K219 Gastro-esophageal reflux disease without esophagitis: Secondary | ICD-10-CM

## 2019-03-15 DIAGNOSIS — Z0000 Encounter for general adult medical examination without abnormal findings: Secondary | ICD-10-CM

## 2019-03-15 DIAGNOSIS — Z13220 Encounter for screening for lipoid disorders: Secondary | ICD-10-CM | POA: Diagnosis not present

## 2019-03-15 DIAGNOSIS — I10 Essential (primary) hypertension: Secondary | ICD-10-CM | POA: Diagnosis not present

## 2019-03-15 DIAGNOSIS — F988 Other specified behavioral and emotional disorders with onset usually occurring in childhood and adolescence: Secondary | ICD-10-CM

## 2019-03-15 MED ORDER — AMPHETAMINE-DEXTROAMPHET ER 10 MG PO CP24: 10.0000 mg | ORAL_CAPSULE | Freq: Every day | ORAL | 0 refills | Status: DC

## 2019-03-15 NOTE — Progress Notes (Signed)
Established Patient Office Visit  Subjective:  Patient ID: Zachary Hopkins, male    DOB: Sep 12, 1985  Age: 34 y.o. MRN: [id number]  CC:  Chief Complaint  Patient presents with  . Annual Exam    HPI Zachary Hopkins is a 34 y.o. caucasian male presenting to the clinic for CPE. History includes GERD, HTN, and ADD. Pt completed a COVID test on 03/01/2019 at [facility] that was negative. No sxs today. Since the pt was last seen he and his wife are expecting a child in March. His medical concerns are desiring to restart Adderal XR for ADD. Discussed initiated at lower dose than prior and follow up in 1 month. He has noticed some growing difficulties staying focused and on task at work. He denied episodes of anxiety, blood pressure <140/90, HR <80. Time spent discussing adverse side effects of treatment, expected outcome of treatment and pt understood. He admits that he has not recently followed exercise regimen with a recent move but plans to restart. He also plans to better follow healthy eating habits. He is agreeable to labs today. Pt reported he had Tdap 11/23/2015 and Flu vaccine 11/2018.   Past Medical History:  Diagnosis Date  . ADD (attention deficit disorder)   . Anxiety    takes propranolol priro to public speaking  . GERD (gastroesophageal reflux disease)   . HTN (hypertension)      Social History   Socioeconomic History  . Marital status: Single    Spouse name: Not on file  . Number of children: Not on file  . Years of education: Not on file  . Highest education level: Not on file  Occupational History  . Not on file  Tobacco Use  . Smoking status: Never Smoker  . Smokeless tobacco: Never Used  Substance and Sexual Activity  . Alcohol use: Yes  . Drug use: No  . Sexual activity: Not on file  Other Topics Concern  . Not on file  Social History Narrative  . Not on file   Social Determinants of Health   Financial Resource Strain:   . Difficulty of Paying Living  Expenses: Not on file  Food Insecurity:   . Worried About Programme researcher, broadcasting/film/video in the Last Year: Not on file  . Ran Out of Food in the Last Year: Not on file  Transportation Needs:   . Lack of Transportation (Medical): Not on file  . Lack of Transportation (Non-Medical): Not on file  Physical Activity:   . Days of Exercise per Week: Not on file  . Minutes of Exercise per Session: Not on file  Stress:   . Feeling of Stress : Not on file  Social Connections:   . Frequency of Communication with Friends and Family: Not on file  . Frequency of Social Gatherings with Friends and Family: Not on file  . Attends Religious Services: Not on file  . Active Member of Clubs or Organizations: Not on file  . Attends Banker Meetings: Not on file  . Marital Status: Not on file  Intimate Partner Violence:   . Fear of Current or Ex-Partner: Not on file  . Emotionally Abused: Not on file  . Physically Abused: Not on file  . Sexually Abused: Not on file    Outpatient Medications Prior to Visit  Medication Sig Dispense Refill  . losartan (COZAAR) 50 MG tablet TAKE ONE TABLET BY MOUTH DAILY 90 tablet 3  . pantoprazole (PROTONIX) 40 MG tablet Take 40  mg by mouth 2 (two) times daily.    . propranolol (INDERAL) 20 MG tablet TAKE 1 TABLET (20 MG TOTAL) BY MOUTH 3 (THREE) TIMES DAILY AS NEEDED FOR PUBLIC SPEAKING 90 tablet 2  . dexlansoprazole (DEXILANT) 60 MG capsule Take 1 capsule (60 mg total) by mouth daily. 30 capsule 5   No facility-administered medications prior to visit.    No Known Allergies  ROS Review of Systems  All other systems reviewed and are negative.     Objective:    Physical Exam  Constitutional: He is oriented to person, place, and time. Vital signs are normal. He appears well-developed and well-nourished.  HENT:  Head: Normocephalic.  Right Ear: Hearing, tympanic membrane, external ear and ear canal normal.  Left Ear: Hearing, tympanic membrane, external ear  and ear canal normal.  Nose: Nose normal.  Mouth/Throat: Uvula is midline, oropharynx is clear and moist and mucous membranes are normal.  Eyes: Pupils are equal, round, and reactive to light. Conjunctivae, EOM and lids are normal. Lids are everted and swept, no foreign bodies found.  Neck: No thyroid mass and no thyromegaly present.  Cardiovascular: Normal rate, regular rhythm, S1 normal, S2 normal, normal heart sounds and normal pulses.  Pulmonary/Chest: Effort normal and breath sounds normal.  Abdominal: Soft. Normal appearance and bowel sounds are normal. There is no hepatosplenomegaly. There is no abdominal tenderness. There is no CVA tenderness. No hernia. Hernia confirmed negative in the ventral area.  Musculoskeletal:        General: Normal range of motion.     Cervical back: Normal range of motion and neck supple.  Lymphadenopathy:       Head (right side): No submental, no submandibular, no tonsillar and no preauricular adenopathy present.       Head (left side): No submental, no submandibular, no tonsillar and no preauricular adenopathy present.       Right cervical: No superficial cervical and no deep cervical adenopathy present.      Left cervical: No superficial cervical and no deep cervical adenopathy present.  Neurological: He is alert and oriented to person, place, and time. He has normal strength. No cranial nerve deficit or sensory deficit.  Skin: Skin is warm, dry and intact.  Psychiatric: He has a normal mood and affect. His speech is normal and behavior is normal. Judgment and thought content normal. Cognition and memory are normal.  Nursing note and vitals reviewed.   BP 124/80   Pulse 63   Temp 97.6 F (36.4 C) (Oral)   Resp 16   Ht 6' (1.829 m)   Wt 197 lb 8 oz (89.6 kg)   SpO2 100%   BMI 26.79 kg/m  Wt Readings from Last 3 Encounters:  03/15/19 197 lb 8 oz (89.6 kg)  04/02/18 191 lb (86.6 kg)  09/17/16 185 lb (83.9 kg)     Health Maintenance Due  Topic  Date Due  . HIV Screening  01/23/2002  . INFLUENZA VACCINE  09/05/2018    There are no preventive care reminders to display for this patient.  Lab Results  Component Value Date   TSH 1.52 11/23/2015   Lab Results  Component Value Date   WBC 5.1 04/02/2018   HGB 15.6 04/02/2018   HCT 45.7 04/02/2018   MCV 88.7 04/02/2018   PLT 304 04/02/2018   Lab Results  Component Value Date   NA 139 04/02/2018   K 4.4 04/02/2018   CO2 29 04/02/2018   GLUCOSE 95 04/02/2018  BUN 19 04/02/2018   CREATININE 0.93 04/02/2018   BILITOT 0.9 04/02/2018   ALKPHOS 68 09/17/2016   AST 19 04/02/2018   ALT 24 04/02/2018   PROT 6.9 04/02/2018   ALBUMIN 4.6 09/17/2016   CALCIUM 9.8 04/02/2018   Lab Results  Component Value Date   CHOL 143 04/02/2018   Lab Results  Component Value Date   HDL 79 04/02/2018   Lab Results  Component Value Date   LDLCALC 51 04/02/2018   Lab Results  Component Value Date   TRIG 44 04/02/2018   Lab Results  Component Value Date   CHOLHDL 1.8 04/02/2018   No results found for: HGBA1C    Assessment & Plan:   Problem List Items Addressed This Visit      Cardiovascular and Mediastinum   Benign essential HTN   Relevant Orders   COMPLETE METABOLIC PANEL WITH GFR   CBC with Differential/Platelet     Digestive   Gastroesophageal reflux disease   Relevant Medications   pantoprazole (PROTONIX) 40 MG tablet     Other   ADD (attention deficit disorder)    Other Visit Diagnoses    General medical exam    -  Primary   Relevant Orders   COMPLETE METABOLIC PANEL WITH GFR   CBC with Differential/Platelet   Lipid panel   Lipid screening       Relevant Orders   Lipid panel      Meds ordered this encounter  Medications  . amphetamine-dextroamphetamine (ADDERALL XR) 10 MG 24 hr capsule    Sig: Take 1 capsule (10 mg total) by mouth daily.    Dispense:  30 capsule    Refill:  0    Follow-up: Return in about 4 weeks (around 04/12/2019), or ADD  started treatment/ follow up.    Elmore Guise, FNP

## 2019-03-15 NOTE — Patient Instructions (Addendum)
Follow one month starting treatment for ADD.  Take blood pressure at home 2-3 hours after medication reporting >140/90.  We will call you for abnormal results from lab work today.

## 2019-03-16 LAB — CBC WITH DIFFERENTIAL/PLATELET
Absolute Monocytes: 432 cells/uL (ref 200–950)
Basophils Absolute: 42 cells/uL (ref 0–200)
Basophils Relative: 0.8 %
Eosinophils Absolute: 52 cells/uL (ref 15–500)
Eosinophils Relative: 1 %
HCT: 47.7 % (ref 38.5–50.0)
Hemoglobin: 16 g/dL (ref 13.2–17.1)
Lymphs Abs: 1903 cells/uL (ref 850–3900)
MCH: 30.1 pg (ref 27.0–33.0)
MCHC: 33.5 g/dL (ref 32.0–36.0)
MCV: 89.7 fL (ref 80.0–100.0)
MPV: 9.5 fL (ref 7.5–12.5)
Monocytes Relative: 8.3 %
Neutro Abs: 2772 cells/uL (ref 1500–7800)
Neutrophils Relative %: 53.3 %
Platelets: 290 10*3/uL (ref 140–400)
RBC: 5.32 10*6/uL (ref 4.20–5.80)
RDW: 13 % (ref 11.0–15.0)
Total Lymphocyte: 36.6 %
WBC: 5.2 10*3/uL (ref 3.8–10.8)

## 2019-03-16 LAB — COMPLETE METABOLIC PANEL WITH GFR
AG Ratio: 2.1 (calc) (ref 1.0–2.5)
ALT: 33 U/L (ref 9–46)
AST: 23 U/L (ref 10–40)
Albumin: 5 g/dL (ref 3.6–5.1)
Alkaline phosphatase (APISO): 60 U/L (ref 36–130)
BUN: 22 mg/dL (ref 7–25)
CO2: 29 mmol/L (ref 20–32)
Calcium: 10 mg/dL (ref 8.6–10.3)
Chloride: 104 mmol/L (ref 98–110)
Creat: 0.99 mg/dL (ref 0.60–1.35)
GFR, Est African American: 116 mL/min/{1.73_m2} (ref >=60)
GFR, Est Non African American: 100 mL/min/{1.73_m2} (ref >=60)
Globulin: 2.4 g/dL (calc) (ref 1.9–3.7)
Glucose, Bld: 96 mg/dL (ref 65–99)
Potassium: 5 mmol/L (ref 3.5–5.3)
Sodium: 140 mmol/L (ref 135–146)
Total Bilirubin: 0.7 mg/dL (ref 0.2–1.2)
Total Protein: 7.4 g/dL (ref 6.1–8.1)

## 2019-03-16 LAB — LIPID PANEL
Cholesterol: 170 mg/dL (ref <200)
HDL: 84 mg/dL (ref >=40)
LDL Cholesterol (Calc): 73 mg/dL (calc)
Non-HDL Cholesterol (Calc): 86 mg/dL (calc) (ref <130)
Total CHOL/HDL Ratio: 2 (calc) (ref <5.0)
Triglycerides: 52 mg/dL (ref <150)

## 2019-04-12 ENCOUNTER — Ambulatory Visit: Payer: 59 | Admitting: Nurse Practitioner

## 2019-04-13 ENCOUNTER — Ambulatory Visit: Payer: 59 | Admitting: Nurse Practitioner

## 2019-04-13 ENCOUNTER — Other Ambulatory Visit: Payer: Self-pay

## 2019-04-13 ENCOUNTER — Encounter: Payer: Self-pay | Admitting: Nurse Practitioner

## 2019-04-13 VITALS — BP 120/72 | HR 76 | Temp 97.9°F | Resp 16 | Ht 72.0 in | Wt 198.0 lb

## 2019-04-13 DIAGNOSIS — F988 Other specified behavioral and emotional disorders with onset usually occurring in childhood and adolescence: Secondary | ICD-10-CM | POA: Diagnosis not present

## 2019-04-13 DIAGNOSIS — Z79899 Other long term (current) drug therapy: Secondary | ICD-10-CM

## 2019-04-13 MED ORDER — AMPHETAMINE-DEXTROAMPHET ER 15 MG PO CP24: 15.0000 mg | ORAL_CAPSULE | ORAL | 0 refills | Status: DC

## 2019-04-13 MED ORDER — PROPRANOLOL HCL 20 MG PO TABS: 20.0000 mg | ORAL_TABLET | Freq: Two times a day (BID) | ORAL | 2 refills | Status: DC

## 2019-04-13 NOTE — Progress Notes (Signed)
Acute Office Visit  Subjective:    Patient ID: Zachary Hopkins, male    DOB: 1985-05-25, 34 y.o.   MRN: [id number]  Chief Complaint  Patient presents with  . Follow-up    med check- is fasting    HPI Zachary Hopkins is a 34 y.o. caucasian male presenting to the clinic for a 4 week f/u after restarting ADD treatment. History includes GERD, HTN, anxiety with large group presentation, and ADD. No hyperactivity. Last month the pt was restarted on Adderal at 10mg  24 hour dose daily r/t sxs of increased difficulties staying focused and on task at work.  He denied episodes of anxiety, blood pressure <140/90, HR <80. Not taking Propanolol daily only when public speaking.  Time spent discussing adverse side effects of treatment, expected outcome of treatment and pt understood.  Today he admits minimal improvement and desires increase. Will follow up in 4 weeks. No cp/ct, gu/gi sxs, pain, edema, sob, falls, no ager outburst, getting along well with contacts and wife, no decrease in appetite or insomnia.    Past Medical History:  Diagnosis Date  . ADD (attention deficit disorder)   . Anxiety    takes propranolol priro to public speaking  . GERD (gastroesophageal reflux disease)   . HTN (hypertension)     Social History   Socioeconomic History  . Marital status: Single    Spouse name: Not on file  . Number of children: Not on file  . Years of education: Not on file  . Highest education level: Not on file  Occupational History  . Not on file  Tobacco Use  . Smoking status: Never Smoker  . Smokeless tobacco: Never Used  Substance and Sexual Activity  . Alcohol use: Yes  . Drug use: No  . Sexual activity: Not on file  Other Topics Concern  . Not on file  Social History Narrative  . Not on file   Social Determinants of Health   Financial Resource Strain:   . Difficulty of Paying Living Expenses: Not on file  Food Insecurity:   . Worried About in  the Last Year: Not on file  . Ran Out of Food in the Last Year: Not on file  Transportation Needs:   . Lack of Transportation (Medical): Not on file  . Lack of Transportation (Non-Medical): Not on file  Physical Activity:   . Days of Exercise per Week: Not on file  . Minutes of Exercise per Session: Not on file  Stress:   . Feeling of Stress : Not on file  Social Connections:   . Frequency of Communication with Friends and Family: Not on file  . Frequency of Social Gatherings with Friends and Family: Not on file  . Attends Religious Services: Not on file  . Active Member of Clubs or Organizations: Not on file  . Attends Programme researcher, broadcasting/film/video Meetings: Not on file  . Marital Status: Not on file  Intimate Partner Violence:   . Fear of Current or Ex-Partner: Not on file  . Emotionally Abused: Not on file  . Physically Abused: Not on file  . Sexually Abused: Not on file    No Known Allergies  Review of Systems  All other systems reviewed and are negative.     Objective:    Physical Exam Vitals and nursing note reviewed.  Constitutional:      Appearance: Normal appearance. He is well-developed, well-groomed and normal weight. He is not ill-appearing.  HENT:     Head: Normocephalic.     Nose: Nose normal.  Eyes:     General: Lids are normal. Lids are everted, no foreign bodies appreciated.     Extraocular Movements: Extraocular movements intact.     Conjunctiva/sclera: Conjunctivae normal.     Pupils: Pupils are equal, round, and reactive to light.  Cardiovascular:     Rate and Rhythm: Normal rate.     Heart sounds: Normal heart sounds.  Pulmonary:     Effort: Pulmonary effort is normal. No accessory muscle usage or respiratory distress.  Abdominal:     General: Abdomen is flat.  Musculoskeletal:     Cervical back: Normal range of motion and neck supple.     Right lower leg: No edema.     Left lower leg: No edema.  Skin:    General: Skin is warm and dry.   Neurological:     General: No focal deficit present.     Mental Status: He is alert and oriented to person, place, and time.  Psychiatric:        Attention and Perception: Attention and perception normal.        Mood and Affect: Mood and affect normal.        Speech: Speech normal.        Behavior: Behavior normal. Behavior is cooperative.        Thought Content: Thought content normal.        Cognition and Memory: Cognition normal.        Judgment: Judgment normal.     BP 120/72   Pulse 76   Temp 97.9 F (36.6 C) (Temporal)   Resp 16   Ht 6' (1.829 m)   Wt 198 lb (89.8 kg)   SpO2 93%   BMI 26.85 kg/m  Wt Readings from Last 3 Encounters:  04/13/19 198 lb (89.8 kg)  03/15/19 197 lb 8 oz (89.6 kg)  04/02/18 191 lb (86.6 kg)    Lab Results  Component Value Date   TSH 1.52 11/23/2015   Lab Results  Component Value Date   WBC 5.2 03/15/2019   HGB 16.0 03/15/2019   HCT 47.7 03/15/2019   MCV 89.7 03/15/2019   PLT 290 03/15/2019   Lab Results  Component Value Date   NA 140 03/15/2019   K 5.0 03/15/2019   CO2 29 03/15/2019   GLUCOSE 96 03/15/2019   BUN 22 03/15/2019   CREATININE 0.99 03/15/2019   BILITOT 0.7 03/15/2019   ALKPHOS 68 09/17/2016   AST 23 03/15/2019   ALT 33 03/15/2019   PROT 7.4 03/15/2019   ALBUMIN 4.6 09/17/2016   CALCIUM 10.0 03/15/2019   Lab Results  Component Value Date   CHOL 170 03/15/2019   Lab Results  Component Value Date   HDL 84 03/15/2019   Lab Results  Component Value Date   LDLCALC 73 03/15/2019   Lab Results  Component Value Date   TRIG 52 03/15/2019   Lab Results  Component Value Date   CHOLHDL 2.0 03/15/2019      Assessment & Plan:  ADD: Adderall increased to 15 mg, refill on propanolol, follow up in 4 weeks after dose increase of ADD medication.  HTN: stable GERD: stable  Problem List Items Addressed This Visit      Other   ADD (attention deficit disorder)   Relevant Medications   propranolol (INDERAL)  20 MG tablet   amphetamine-dextroamphetamine (ADDERALL XR) 15 MG 24 hr capsule  Other Visit Diagnoses    Encounter for medication review    -  Primary   Relevant Medications   propranolol (INDERAL) 20 MG tablet   amphetamine-dextroamphetamine (ADDERALL XR) 15 MG 24 hr capsule     Follow Up:  4 weeks; ADD   Annie Main, FNP

## 2019-04-23 ENCOUNTER — Ambulatory Visit: Payer: Commercial Managed Care - HMO | Attending: Internal Medicine

## 2019-04-23 ENCOUNTER — Ambulatory Visit: Payer: Commercial Managed Care - HMO

## 2019-04-23 DIAGNOSIS — Z23 Encounter for immunization: Secondary | ICD-10-CM

## 2019-04-23 NOTE — Progress Notes (Signed)
   Covid-19 Vaccination Clinic  Name:  Rorik Vespa    MRN: [id number] DOB: 1985-12-06  04/23/2019  Mr. Delmonaco was observed post Covid-19 immunization for 15 minutes without incident. He was provided with Vaccine Information Sheet and instruction to access the V-Safe system.   Mr. Azer was instructed to call 911 with any severe reactions post vaccine: Marland Kitchen Difficulty breathing  . Swelling of face and throat  . A fast heartbeat  . A bad rash all over body  . Dizziness and weakness   Immunizations Administered    Name Date Dose VIS Date Route   Moderna COVID-19 Vaccine 04/23/2019 11:40 AM 0.5 mL 01/05/2019 Intramuscular   Manufacturer: Moderna   Lot: [id number]   NDC: 74827-078-67

## 2019-05-17 ENCOUNTER — Ambulatory Visit: Payer: 59 | Admitting: Family Medicine

## 2019-05-25 ENCOUNTER — Ambulatory Visit: Payer: Commercial Managed Care - HMO | Attending: Internal Medicine

## 2019-05-25 DIAGNOSIS — Z23 Encounter for immunization: Secondary | ICD-10-CM

## 2019-05-25 NOTE — Progress Notes (Signed)
   Covid-19 Vaccination Clinic  Name:  Zachary Hopkins    MRN: [id number] DOB: 15-Jun-1985  05/25/2019  Mr. [name] was observed post Covid-19 immunization for 15 minutes without incident. He was provided with Vaccine Information Sheet and instruction to access the V-Safe system.   Mr. [name] was instructed to call 911 with any severe reactions post vaccine: Marland Kitchen Difficulty breathing  . Swelling of face and throat  . A fast heartbeat  . A bad rash all over body  . Dizziness and weakness   Immunizations Administered    Name Date Dose VIS Date Route   Moderna COVID-19 Vaccine 05/25/2019 10:34 AM 0.5 mL 01/2019 Intramuscular   Manufacturer: Moderna   Lot: [id number]   NDC: 02409-735-32

## 2019-06-04 ENCOUNTER — Encounter: Payer: Self-pay | Admitting: Family Medicine

## 2019-06-04 ENCOUNTER — Other Ambulatory Visit: Payer: Self-pay

## 2019-06-04 ENCOUNTER — Ambulatory Visit: Payer: 59 | Admitting: Family Medicine

## 2019-06-04 VITALS — BP 120/72 | HR 60 | Temp 97.0°F | Resp 12 | Ht 72.0 in | Wt 198.0 lb

## 2019-06-04 DIAGNOSIS — Z79899 Other long term (current) drug therapy: Secondary | ICD-10-CM | POA: Diagnosis not present

## 2019-06-04 DIAGNOSIS — F988 Other specified behavioral and emotional disorders with onset usually occurring in childhood and adolescence: Secondary | ICD-10-CM

## 2019-06-04 DIAGNOSIS — I10 Essential (primary) hypertension: Secondary | ICD-10-CM | POA: Diagnosis not present

## 2019-06-04 MED ORDER — MELOXICAM 15 MG PO TABS: 15.0000 mg | ORAL_TABLET | Freq: Every day | ORAL | 0 refills | Status: DC

## 2019-06-04 MED ORDER — AMPHETAMINE-DEXTROAMPHET ER 15 MG PO CP24: 15.0000 mg | ORAL_CAPSULE | ORAL | 0 refills | Status: DC

## 2019-06-04 NOTE — Addendum Note (Signed)
Addended by: Lynnea Ferrier T on: 06/04/2019 10:04 AM   Modules accepted: Orders

## 2019-06-04 NOTE — Progress Notes (Signed)
Subjective:    Patient ID: Zachary Hopkins, male    DOB: 02-01-1986, 34 y.o.   MRN: [id number]  HPI  2/20 Patient is a very pleasant 34 year old Caucasian male here today for complete physical exam.  Since I last saw the patient, he is changed careers.  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  He seems to be much happier in this.  He is married.  At the present time there are no children although they are planning.  His only medical concern is severe heartburn.  He states that recently he has been dealing with indigestion almost on a daily basis.  He describes it as a "sensation" in his mid esophagus made worse by eating.  He also reports a constant burning discomfort in the center of his chest made worse by food.  Patient had tried famotidine with very little relief.  He is also tried 2 weeks of Nexium with no relief.  He been trying peppermint all with minimal success.  Patient cut caffeine and coffee out of his diet earlier this week and for the last 4 days has been asymptomatic.  He denies any melena or hematochezia.  He denies any odynophagia.  He does report some mild dysphagia however this has improved since discontinuing coffee.  He denies any hematemesis or nausea or vomiting.  At that time, my plan was: Physical exam today is completely normal.  Patient will receive his flu shot.  He declines HIV screening due to lack of risk factors.  Blood pressure at home is excellent on the losartan.  I believe the patient is having acid reflux and likely esophagitis due to this.  I encouraged the patient to continue his dietary changes and we will also start him on Dexilant 60 mg p.o. every morning and then reassess in 2 weeks.  If symptoms persist, I would recommend a GI consult for EGD.  I will also screen the patient for Helicobacter pylori.  Also check CBC, CMP, fasting lipid panel.  06/04/19 1 week ago, the patient injured his knee during neurology rounds.  He complains of  pain over the superior portion of the medial tibial plateau of his left knee.  There is no erythema.  There is no effusion.  There is no restricted range of motion.  There is some mild tenderness to palpation.  He denies any laxity to varus or valgus stress.  He has a negative anterior and posterior drawer sign today as well as a negative Apley grind.  I suspect cartilage irritation due to the jumping.  He is not taking any anti-inflammatories.  Blood pressure today is outstanding at 120/72.  His most recent lab work was checked in February and was outstanding.  I reviewed this with him today.  Since I last saw the patient he has had a baby girl.  She is doing well.  His family is healthy overall and he has had his Covid vaccine. Past Medical History:  Diagnosis Date  . ADD (attention deficit disorder)   . Anxiety    takes propranolol priro to public speaking  . GERD (gastroesophageal reflux disease)   . HTN (hypertension)     Current Outpatient Medications on File Prior to Visit  Medication Sig Dispense Refill  . amphetamine-dextroamphetamine (ADDERALL XR) 15 MG 24 hr capsule Take 1 capsule by mouth every morning. 30 capsule 0  . losartan (COZAAR) 50 MG tablet TAKE ONE TABLET BY MOUTH DAILY 90 tablet 3  .  pantoprazole (PROTONIX) 40 MG tablet Take 40 mg by mouth 2 (two) times daily.    . propranolol (INDERAL) 20 MG tablet Take 1 tablet (20 mg total) by mouth 2 (two) times daily. As needed for public speaking 60 tablet 2   No current facility-administered medications on file prior to visit.   No Known Allergies Social History   Socioeconomic History  . Marital status: Single    Spouse name: Not on file  . Number of children: Not on file  . Years of education: Not on file  . Highest education level: Not on file  Occupational History  . Not on file  Tobacco Use  . Smoking status: Never Smoker  . Smokeless tobacco: Never Used  Substance and Sexual Activity  . Alcohol use: Yes  . Drug  use: No  . Sexual activity: Not on file  Other Topics Concern  . Not on file  Social History Narrative  . Not on file   Social Determinants of Health   Financial Resource Strain:   . Difficulty of Paying Living Expenses:   Food Insecurity:   . Worried About Charity fundraiser in the Last Year:   . Arboriculturist in the Last Year:   Transportation Needs:   . Film/video editor (Medical):   Marland Kitchen Lack of Transportation (Non-Medical):   Physical Activity:   . Days of Exercise per Week:   . Minutes of Exercise per Session:   Stress:   . Feeling of Stress :   Social Connections:   . Frequency of Communication with Friends and Family:   . Frequency of Social Gatherings with Friends and Family:   . Attends Religious Services:   . Active Member of Clubs or Organizations:   . Attends Archivist Meetings:   Marland Kitchen Marital Status:   Intimate Partner Violence:   . Fear of Current or Ex-Partner:   . Emotionally Abused:   Marland Kitchen Physically Abused:   . Sexually Abused:    No family history on file.    Review of Systems  All other systems reviewed and are negative.      Objective:   Physical Exam Vitals reviewed.  Constitutional:      General: He is not in acute distress.    Appearance: Normal appearance. He is normal weight. He is not ill-appearing, toxic-appearing or diaphoretic.  HENT:     Head: Normocephalic and atraumatic.     Right Ear: Tympanic membrane, ear canal and external ear normal. There is no impacted cerumen.     Left Ear: Tympanic membrane, ear canal and external ear normal. There is no impacted cerumen.     Nose: Nose normal. No congestion or rhinorrhea.     Mouth/Throat:     Mouth: Mucous membranes are moist.     Pharynx: No oropharyngeal exudate or posterior oropharyngeal erythema.  Eyes:     General: No scleral icterus.       Right eye: No discharge.        Left eye: No discharge.     Extraocular Movements: Extraocular movements intact.      Conjunctiva/sclera: Conjunctivae normal.     Pupils: Pupils are equal, round, and reactive to light.  Neck:     Vascular: No carotid bruit.  Cardiovascular:     Rate and Rhythm: Normal rate and regular rhythm.     Pulses: Normal pulses.     Heart sounds: Normal heart sounds. No murmur. No friction rub. No gallop.  Pulmonary:     Effort: Pulmonary effort is normal. No respiratory distress.     Breath sounds: Normal breath sounds. No stridor. No wheezing, rhonchi or rales.  Chest:     Chest wall: No tenderness.  Musculoskeletal:     Cervical back: Normal range of motion and neck supple. No rigidity. No muscular tenderness.     Left knee: Bony tenderness present. Normal range of motion. Tenderness present. No MCL, LCL, ACL or PCL tenderness. No LCL laxity, MCL laxity or ACL laxity.Normal alignment and normal meniscus.     Right lower leg: No edema.     Left lower leg: No edema.       Legs:  Lymphadenopathy:     Cervical: No cervical adenopathy.  Neurological:     General: No focal deficit present.     Mental Status: He is alert and oriented to person, place, and time. Mental status is at baseline.           Assessment & Plan:  Benign essential HTN - Plan: CBC with Differential/Platelet, COMPLETE METABOLIC PANEL WITH GFR  Attention deficit disorder (ADD) without hyperactivity  Patient's blood pressure is well controlled.  We will make no changes in his antihypertensives.  He is doing quite well on Adderall extended release 15 mg a day.  He is actually only taking the medication 3 to 4 days a week.  I encouraged the patient to take the medication as sparingly as possible.  He mainly uses it on days when he needs to focus at work.  Otherwise he is able to compensate on his iron the majority of the time.  I do believe he injured the cartilage on the superior surface of the medial tibial plateau.  I have recommended meloxicam 15 mg a day for 2 to 3 weeks and then recheck if no better or  recheck sooner if worse.  Also recommended relative rest and crosstraining to avoid recurrent impact/injury

## 2019-06-27 ENCOUNTER — Other Ambulatory Visit: Payer: Self-pay | Admitting: Family Medicine

## 2019-07-04 ENCOUNTER — Other Ambulatory Visit: Payer: Self-pay | Admitting: Nurse Practitioner

## 2019-07-04 DIAGNOSIS — Z79899 Other long term (current) drug therapy: Secondary | ICD-10-CM

## 2019-07-04 DIAGNOSIS — F988 Other specified behavioral and emotional disorders with onset usually occurring in childhood and adolescence: Secondary | ICD-10-CM

## 2019-07-06 ENCOUNTER — Other Ambulatory Visit: Payer: Self-pay

## 2019-07-06 DIAGNOSIS — Z79899 Other long term (current) drug therapy: Secondary | ICD-10-CM

## 2019-07-06 DIAGNOSIS — F988 Other specified behavioral and emotional disorders with onset usually occurring in childhood and adolescence: Secondary | ICD-10-CM

## 2019-07-06 MED ORDER — AMPHETAMINE-DEXTROAMPHET ER 15 MG PO CP24: 15.0000 mg | ORAL_CAPSULE | ORAL | 0 refills | Status: DC

## 2019-07-06 NOTE — Progress Notes (Signed)
Last filled: 06/04/2019 Last office visit: 06/04/2019

## 2019-08-16 ENCOUNTER — Telehealth: Payer: Self-pay | Admitting: Family Medicine

## 2019-08-16 ENCOUNTER — Other Ambulatory Visit: Payer: Self-pay

## 2019-08-16 DIAGNOSIS — Z79899 Other long term (current) drug therapy: Secondary | ICD-10-CM

## 2019-08-16 DIAGNOSIS — F988 Other specified behavioral and emotional disorders with onset usually occurring in childhood and adolescence: Secondary | ICD-10-CM

## 2019-08-16 MED ORDER — AMPHETAMINE-DEXTROAMPHET ER 15 MG PO CP24: 15.0000 mg | ORAL_CAPSULE | ORAL | 0 refills | Status: DC

## 2019-08-16 NOTE — Telephone Encounter (Signed)
Rx sent to provider 

## 2019-08-16 NOTE — Telephone Encounter (Signed)
CB# [phone]  Refill Adderall xr

## 2019-08-16 NOTE — Telephone Encounter (Signed)
Adderall last refilled 07/06/19

## 2019-09-13 ENCOUNTER — Telehealth: Payer: Self-pay | Admitting: *Deleted

## 2019-09-13 NOTE — Telephone Encounter (Signed)
Pt called and states he has been seeing dr pickard for elbow and knee pain and taking meloxicam - not helping much. Would like a referral for PT. Would like to see Arnette Norris at [facility] rehab in [place] number is [phone]

## 2019-09-14 ENCOUNTER — Other Ambulatory Visit: Payer: Self-pay | Admitting: Family Medicine

## 2019-09-14 DIAGNOSIS — M25569 Pain in unspecified knee: Secondary | ICD-10-CM

## 2019-09-22 ENCOUNTER — Ambulatory Visit: Payer: 59 | Attending: Family Medicine | Admitting: Physical Therapy

## 2019-09-22 ENCOUNTER — Encounter: Payer: Self-pay | Admitting: Physical Therapy

## 2019-09-22 ENCOUNTER — Other Ambulatory Visit: Payer: Self-pay

## 2019-09-22 DIAGNOSIS — M6281 Muscle weakness (generalized): Secondary | ICD-10-CM | POA: Insufficient documentation

## 2019-09-22 DIAGNOSIS — R252 Cramp and spasm: Secondary | ICD-10-CM | POA: Diagnosis present

## 2019-09-22 NOTE — Patient Instructions (Signed)

## 2019-09-22 NOTE — Therapy (Signed)
[facility] [address], [address] [place], [place], [address] Phone: [phone]   Fax:  [phone]  Physical Therapy Evaluation  Patient Details  Name: Zachary Hopkins MRN: [id number] Date of Birth: 20-Nov-1985 Referring Provider (PT): Dr. Lynnea Ferrier   Encounter Date: 09/22/2019   PT End of Session - 09/22/19 0934    Visit Number 1    Date for PT Re-Evaluation 11/17/19    Authorization Type UHC    PT Start Time 0800    PT Stop Time 0845    PT Time Calculation (min) 45 min    Activity Tolerance Patient tolerated treatment well;No increased pain    Behavior During Therapy WFL for tasks assessed/performed           Past Medical History:  Diagnosis Date  . ADD (attention deficit disorder)   . Anxiety    takes propranolol priro to public speaking  . GERD (gastroesophageal reflux disease)   . HTN (hypertension)     History reviewed. No pertinent surgical history.  There were no vitals filed for this visit.    Subjective Assessment - 09/22/19 0804    Subjective I have 3 issues including the left calf. Patient [place] running on the beach and hurt his left calf and achilles tendon. the swelling has never went away. The left calf stays tight. The left shoulder has pain. Had surgery on the left shoulderThe left shoulder feels tight in the back of left shoulder. Unable to do reverse curls in the left elbow.    Patient Stated Goals reduce the pain in the left calf    Currently in Pain? Yes    Pain Score 5     Pain Location Calf    Pain Orientation Left    Pain Descriptors / Indicators Dull    Pain Type Chronic pain    Pain Onset More than a month ago    Pain Frequency Intermittent    Aggravating Factors  exercise    Pain Relieving Factors sitting and leg at rest    Multiple Pain Sites No              [facility] PT Assessment - 09/22/19 0001      Assessment   Medical Diagnosis M25.569 Knee pain, unspecified  chroniciy, unspecified laterality    Referring Provider (PT) Dr. Lynnea Ferrier    Onset Date/Surgical Date --   chronic   Prior Therapy none      Precautions   Precautions None      Restrictions   Weight Bearing Restrictions No      Balance Screen   Has the patient fallen in the past 6 months No    Has the patient had a decrease in activity level because of a fear of falling?  No    Is the patient reluctant to leave their home because of a fear of falling?  No      Home [employer] information centre manager residence      Prior Function   Level of Independence Independent    Leisure run 15 miles per week outside;  workout      Cognition   Overall Cognitive Status Within Functional Limits for tasks assessed      Observation/Other Assessments   Focus on Therapeutic Outcomes (FOTO)  38% limitation; goal is 20% limitation      Posture/Postural Control   Posture/Postural Control No significant limitations      ROM / Strength  AROM / PROM / Strength AROM;PROM;Strength      AROM   Overall AROM Comments left knee ROM i sfull      Strength   Left Knee Flexion 4/5    Left Ankle Plantar Flexion 4/5    Left Ankle Inversion 4/5    Left Ankle Eversion 4/5      Palpation   SI assessment  ASIS Is equal    Palpation comment tightness in the left soleus and where the gastroc meets the soleus                      Objective measurements completed on examination: See above findings.       [facility] Adult PT Treatment/Exercise - 09/22/19 0001      Manual Therapy   Manual Therapy Soft tissue mobilization    Manual therapy comments used assistive device to the left peroneal and soleus muscle     Soft tissue mobilization to left gastroc, soleus, and peroneal muscles            Trigger Point Dry Needling - 09/22/19 0001    Consent Given? Yes    Education Handout Provided Yes    Muscles Treated Head and Neck --    Muscles Treated Lower Quadrant  Peroneals;Gastrocnemius;Soleus   left   Peroneals Response Twitch response elicited;Palpable increased muscle length    Gastrocnemius Response Twitch response elicited;Palpable increased muscle length    Soleus Response Twitch response elicited;Palpable increased muscle length                PT Education - 09/22/19 0933    Education Details information on dry needling    Person(s) Educated Patient    Methods Explanation;Handout    Comprehension Verbalized understanding            PT Short Term Goals - 09/22/19 0951      PT SHORT TERM GOAL #1   Title independent with initial HEP    Time 4    Period Weeks    Status New    Target Date 10/20/19      PT SHORT TERM GOAL #2   Title left calf and knee pain decreased >/= 25%    Time 4    Period Weeks    Status New    Target Date 10/20/19             PT Long Term Goals - 09/22/19 0951      PT LONG TERM GOAL #1   Title independent with advanced HEP    Time 8    Period Weeks    Status New    Target Date 11/17/19      PT LONG TERM GOAL #2   Title able to run on outside terrain without left calf pain due to improved tissue mobility and strength    Time 8    Period Weeks    Status New    Target Date 11/17/19      PT LONG TERM GOAL #3   Title able to squat to pick up his child from the floor with out diffculty due to improved strength 5/5 in left knee and ankle    Time 8    Period Weeks    Status New    Target Date 11/17/19      PT LONG TERM GOAL #4   Title able to hop on left let without pain for his workouts due to increased stability in left knee and ankle  Time 8    Period Weeks    Status New    Target Date 11/17/19      PT LONG TERM GOAL #5   Title FOTO score decreased from 38% limitation to 20% limitation    Time 8    Period Weeks    Status New    Target Date 11/17/19                  Plan - 09/22/19 0934    Clinical Impression Statement Patient is a 34 year old male with chronic  left calf pain that affects his knee. Patient also reported issues with his left shoulder but therapist informed him about needing a new prescription for evaluation of the left shoulder. Patient has difficulty with squatting due to pain in left knee and calf. Patient reports left calf pain at level 5/10 when he exercises. Patient will have pain with running outside. Left knee hamstring strength is 4/5. Left ankle inversion, eversion, and plantarflexion is 4/5. He has tightness in the left lower gastroc, and the soleus, and left peroneal muscle. Swelling along the medial left lower leg. Patient will benefit from skilled therapy to reduce pain in left calf and knee and improve muscle mobiity and strength.    Personal Factors and Comorbidities Fitness    Examination-Activity Limitations Squat    Stability/Clinical Decision Making Stable/Uncomplicated    Clinical Decision Making Low    Rehab Potential Excellent    PT Frequency 1x / week    PT Duration 8 weeks    PT Treatment/Interventions ADLs/Self Care Home Management;Cryotherapy;Electrical Stimulation;Iontophoresis 4mg /ml Dexamethasone;Moist Heat;Ultrasound;Neuromuscular re-education;Therapeutic exercise;Therapeutic activities;Patient/family education;Manual techniques;Dry needling;Taping    PT Next Visit Plan gastroc and soleus stretches for home, soleus eccentric exercise, dry needle the left soleus, gastroc and peroneal muscle, work on left hamstring strength; patient is trying to get a script for his left shoulder    Consulted and Agree with Plan of Care Patient           Patient will benefit from skilled therapeutic intervention in order to improve the following deficits and impairments:  Increased fascial restricitons, Pain, Decreased strength, Decreased activity tolerance  Visit Diagnosis: Muscle weakness (generalized) - Plan: PT plan of care cert/re-cert  Cramp and spasm - Plan: PT plan of care cert/re-cert     Problem List Patient  Active Problem List   Diagnosis Date Noted  . Benign essential HTN 03/15/2019  . Gastroesophageal reflux disease 03/15/2019  . ADD (attention deficit disorder)     05/13/2019, PT 09/22/19 9:58 AM ' [facility] [address] [place], [place], [place] Phone: [phone]   Fax:  [phone]  Name: Landry Kamath MRN: Becky Augusta Date of Birth: 12-06-1985

## 2019-09-24 ENCOUNTER — Other Ambulatory Visit: Payer: Self-pay | Admitting: *Deleted

## 2019-09-24 DIAGNOSIS — Z79899 Other long term (current) drug therapy: Secondary | ICD-10-CM

## 2019-09-24 DIAGNOSIS — F988 Other specified behavioral and emotional disorders with onset usually occurring in childhood and adolescence: Secondary | ICD-10-CM

## 2019-09-24 MED ORDER — AMPHETAMINE-DEXTROAMPHET ER 15 MG PO CP24: 15.0000 mg | ORAL_CAPSULE | ORAL | 0 refills | Status: DC

## 2019-09-24 NOTE — Telephone Encounter (Signed)
Received call from patient.   Requested refill on Adderall.   Ok to refill??  Last office visit 09/14/2019.  Last refill 08/16/2019.

## 2019-09-27 ENCOUNTER — Ambulatory Visit: Payer: 59 | Admitting: Physical Therapy

## 2019-09-27 ENCOUNTER — Other Ambulatory Visit: Payer: Self-pay | Admitting: Family Medicine

## 2019-09-27 ENCOUNTER — Other Ambulatory Visit: Payer: Self-pay

## 2019-09-27 DIAGNOSIS — Z79899 Other long term (current) drug therapy: Secondary | ICD-10-CM

## 2019-09-27 DIAGNOSIS — M6281 Muscle weakness (generalized): Secondary | ICD-10-CM | POA: Diagnosis not present

## 2019-09-27 DIAGNOSIS — R252 Cramp and spasm: Secondary | ICD-10-CM

## 2019-09-27 DIAGNOSIS — F988 Other specified behavioral and emotional disorders with onset usually occurring in childhood and adolescence: Secondary | ICD-10-CM

## 2019-09-27 NOTE — Patient Instructions (Signed)
Access Code: [email or web address] URL: [email or web address] Date: 09/27/2019 Prepared by: Dwana Curd  Exercises Arch Lifting - 1 x daily - 7 x weekly - 3 sets - 10 reps Standing Eccentric Heel Raise - 1 x daily - 7 x weekly - 3 sets - 10 reps Soleus Stretch on Wall - 1 x daily - 7 x weekly - 3 reps - 1 sets - 30 sec hold Standing Gastroc Stretch - 1 x daily - 7 x weekly - 3 reps - 1 sets - 30 sec hold

## 2019-09-27 NOTE — Therapy (Signed)
[facility] [address] [place], [place], [address] Phone: [phone]   Fax:  [phone]  Physical Therapy Treatment  Patient Details  Name: Zachary Hopkins MRN: [id number] Date of Birth: 1985/12/09 Referring Provider (PT): Dr. Lynnea Ferrier   Encounter Date: 09/27/2019   PT End of Session - 09/27/19 1105    Visit Number 2    Date for PT Re-Evaluation 11/17/19    Authorization Type UHC    PT Start Time 0933    PT Stop Time 1015    PT Time Calculation (min) 42 min    Activity Tolerance Patient tolerated treatment well;No increased pain    Behavior During Therapy WFL for tasks assessed/performed           Past Medical History:  Diagnosis Date  . ADD (attention deficit disorder)   . Anxiety    takes propranolol priro to public speaking  . GERD (gastroesophageal reflux disease)   . HTN (hypertension)     No past surgical history on file.  There were no vitals filed for this visit.   Subjective Assessment - 09/27/19 1119    Subjective It may have felt looser after the dry needling.  My left calf is still tight, I ran 5 miles yesterday.  The pain limits how far I can run.    Patient Stated Goals reduce the pain in the left calf    Currently in Pain? No/denies                             [facility] Adult PT Treatment/Exercise - 09/27/19 0001      Exercises   Exercises Ankle      Manual Therapy   Soft tissue mobilization to left gastroc, soleus, and peroneal musclesl plantarflexors, tibialis posterior muscle and attachments      Ankle Exercises: Stretches   Soleus Stretch 3 reps;20 seconds    Gastroc Stretch 3 reps;20 seconds      Ankle Exercises: Standing   Heel Raises 10 reps   eccentric lowering   Other Standing Ankle Exercises arch lifting educated and performed            Trigger Point Dry Needling - 09/27/19 0001    Consent Given? Yes    Education Handout Provided  Previously provided    Muscles Treated Lower Quadrant Posterior tibialis    Posterior tibialis Response Twitch response elicited;Palpable increased muscle length    Gastrocnemius Response Twitch response elicited;Palpable increased muscle length    Soleus Response Twitch response elicited;Palpable increased muscle length                PT Education - 09/27/19 1104    Education Details Access Code: [email or web address]    Person(s) Educated Patient    Methods Explanation;Demonstration;Verbal cues;Handout    Comprehension Verbalized understanding;Returned demonstration;Tactile cues required            PT Short Term Goals - 09/27/19 1119      PT SHORT TERM GOAL #1   Title independent with initial HEP    Status On-going      PT SHORT TERM GOAL #2   Title left calf and knee pain decreased >/= 25%    Status On-going             PT Long Term Goals - 09/22/19 0951      PT LONG TERM GOAL #1   Title independent with advanced HEP  Time 8    Period Weeks    Status New    Target Date 11/17/19      PT LONG TERM GOAL #2   Title able to run on outside terrain without left calf pain due to improved tissue mobility and strength    Time 8    Period Weeks    Status New    Target Date 11/17/19      PT LONG TERM GOAL #3   Title able to squat to pick up his child from the floor with out diffculty due to improved strength 5/5 in left knee and ankle    Time 8    Period Weeks    Status New    Target Date 11/17/19      PT LONG TERM GOAL #4   Title able to hop on left let without pain for his workouts due to increased stability in left knee and ankle    Time 8    Period Weeks    Status New    Target Date 11/17/19      PT LONG TERM GOAL #5   Title FOTO score decreased from 38% limitation to 20% limitation    Time 8    Period Weeks    Status New    Target Date 11/17/19                 Plan - 09/27/19 1107    Clinical Impression Statement Pt continues to have tension in  the calf.  He did not feel any changes immediately after today's treatment.  He thought the needling may have made it looser previously.  Pt was given HEP today including arch and calf exercises.  He has tension in gastroc, solues, tibialis posterior and plantarflexors    PT Treatment/Interventions ADLs/Self Care Home Management;Cryotherapy;Electrical Stimulation;Iontophoresis 4mg /ml Dexamethasone;Moist Heat;Ultrasound;Neuromuscular re-education;Therapeutic exercise;Therapeutic activities;Patient/family education;Manual techniques;Dry needling;Taping    PT Next Visit Plan f/u on eccentric calf and arch exercises; add tband eversion and inversion, dry needling left soleus, gastroc, peroneal and tibialis posterior    PT Home Exercise Plan Access Code: [email or web address]    Consulted and Agree with Plan of Care Patient           Patient will benefit from skilled therapeutic intervention in order to improve the following deficits and impairments:  Increased fascial restricitons, Pain, Decreased strength, Decreased activity tolerance  Visit Diagnosis: Muscle weakness (generalized)  Cramp and spasm     Problem List Patient Active Problem List   Diagnosis Date Noted  . Benign essential HTN 03/15/2019  . Gastroesophageal reflux disease 03/15/2019  . ADD (attention deficit disorder)     05/13/2019, PT 09/27/2019, 11:26 AM  [facility] [address] [place], [place], [place] Phone: [phone]   Fax:  [phone]  Name: Zachary Hopkins MRN: Zachary Hopkins Date of Birth: 03-Jun-1985

## 2019-09-29 ENCOUNTER — Other Ambulatory Visit: Payer: Self-pay

## 2019-09-29 MED ORDER — LOSARTAN POTASSIUM 50 MG PO TABS: 50.0000 mg | ORAL_TABLET | Freq: Every day | ORAL | 3 refills | Status: DC

## 2019-10-07 ENCOUNTER — Encounter: Payer: Self-pay | Admitting: Physical Therapy

## 2019-10-07 ENCOUNTER — Ambulatory Visit: Payer: 59 | Attending: Family Medicine | Admitting: Physical Therapy

## 2019-10-07 ENCOUNTER — Other Ambulatory Visit: Payer: Self-pay

## 2019-10-07 DIAGNOSIS — M6281 Muscle weakness (generalized): Secondary | ICD-10-CM | POA: Insufficient documentation

## 2019-10-07 DIAGNOSIS — R252 Cramp and spasm: Secondary | ICD-10-CM | POA: Insufficient documentation

## 2019-10-07 NOTE — Therapy (Addendum)
[facility] [address], [place], [place], [address] Phone: [phone]   Fax:  [phone]  Physical Therapy Treatment  Patient Details  Name: Zachary Hopkins MRN: [id number] Date of Birth: 1985/08/21 Referring Provider (PT): Dr. Jenna Luo   Encounter Date: 10/07/2019   PT End of Session - 10/07/19 1618    Visit Number 3    Date for PT Re-Evaluation 11/17/19    Authorization Type UHC    PT Start Time 4944    PT Stop Time 9675    PT Time Calculation (min) 45 min    Activity Tolerance Patient tolerated treatment well;No increased pain    Behavior During Therapy WFL for tasks assessed/performed           Past Medical History:  Diagnosis Date  . ADD (attention deficit disorder)   . Anxiety    takes propranolol priro to public speaking  . GERD (gastroesophageal reflux disease)   . HTN (hypertension)     History reviewed. No pertinent surgical history.  There were no vitals filed for this visit.   Subjective Assessment - 10/07/19 1617    Subjective I think it is a little better.  The swelling on the inner left leg is still there.  Only running 3-5 miles at a time.    Patient Stated Goals reduce the pain in the left calf    Currently in Pain? No/denies                             [facility] Adult PT Treatment/Exercise - 10/08/19 0001      Neuro Re-ed    Neuro Re-ed Details  single leg stand on BOSU - LE abdcution; step down from 6" - all needing cues to perform correctly      Manual Therapy   Manual Therapy Myofascial release    Soft tissue mobilization to left gastroc, soleus, and peroneal musclesl plantarflexors, tibialis posterior muscle and attachments    Myofascial Release cupping along medial and lateral calf            Trigger Point Dry Needling - 10/08/19 0001    Consent Given? Yes    Education Handout Provided Previously provided    Peroneals Response Twitch  response elicited;Palpable increased muscle length    Posterior tibialis Response Twitch response elicited;Palpable increased muscle length    Gastrocnemius Response Twitch response elicited;Palpable increased muscle length    Soleus Response Twitch response elicited;Palpable increased muscle length                  PT Short Term Goals - 09/27/19 1119      PT SHORT TERM GOAL #1   Title independent with initial HEP    Status On-going      PT SHORT TERM GOAL #2   Title left calf and knee pain decreased >/= 25%    Status On-going             PT Long Term Goals - 09/22/19 0951      PT LONG TERM GOAL #1   Title independent with advanced HEP    Time 8    Period Weeks    Status New    Target Date 11/17/19      PT LONG TERM GOAL #2   Title able to run on outside terrain without left calf pain due to improved tissue mobility and strength    Time 8  Period Weeks    Status New    Target Date 11/17/19      PT LONG TERM GOAL #3   Title able to squat to pick up his child from the floor with out diffculty due to improved strength 5/5 in left knee and ankle    Time 8    Period Weeks    Status New    Target Date 11/17/19      PT LONG TERM GOAL #4   Title able to hop on left let without pain for his workouts due to increased stability in left knee and ankle    Time 8    Period Weeks    Status New    Target Date 11/17/19      PT LONG TERM GOAL #5   Title FOTO score decreased from 38% limitation to 20% limitation    Time 8    Period Weeks    Status New    Target Date 11/17/19                 Plan - 10/07/19 1605    Clinical Impression Statement Pt reports maybe feeling a little better.  His pain is brought on more intensely with cold or rain. Pt responded well to dry needling and using cup for fascial release.  Pt had some reduction of knots with more separated adhesions but pt did not report immediately feeling much change.  Pt felt good release with  cupping and was educated on where to purchse these.  Pt was challenged with single leg on BOSU and will benefit from ankle and LE stabilizing exercises.    PT Treatment/Interventions ADLs/Self Care Home Management;Cryotherapy;Electrical Stimulation;Iontophoresis 7m/ml Dexamethasone;Moist Heat;Ultrasound;Neuromuscular re-education;Therapeutic exercise;Therapeutic activities;Patient/family education;Manual techniques;Dry needling;Taping    PT Next Visit Plan f/u on cupping and BOSU ball; fascial release DN to gastroc/soleus/posterior tib/peroneals; ankle and hip stability single leg squat    PT Home Exercise Plan Access Code: [email or web address]    Consulted and Agree with Plan of Care Patient           Patient will benefit from skilled therapeutic intervention in order to improve the following deficits and impairments:  Increased fascial restricitons, Pain, Decreased strength, Decreased activity tolerance  Visit Diagnosis: Muscle weakness (generalized)  Cramp and spasm     Problem List Patient Active Problem List   Diagnosis Date Noted  . Benign essential HTN 03/15/2019  . Gastroesophageal reflux disease 03/15/2019  . ADD (attention deficit disorder)     JJule Ser PT 10/08/2019, 11:58 AM  [facility] [address] [place] [address]Phone: [phone]  Fax:  [phone] Name: Zachary MarroMRN: [id number] of Birth: 111-18-1987 PHYSICAL THERAPY DISCHARGE SUMMARY  Visits from Start of Care: 3  Current functional level related to goals / functional outcomes: See above goals   Remaining deficits: See above   Education / Equipment: HEP  Plan: Patient agrees to discharge.  Patient goals were not met. Patient is being discharged due to the patient's request.  ?????     Pt called reported feeling better  JGustavus Bryant PT 10/25/19 9:01 AM

## 2019-10-14 ENCOUNTER — Encounter: Payer: Commercial Managed Care - HMO | Admitting: Physical Therapy

## 2019-10-25 ENCOUNTER — Encounter: Payer: Commercial Managed Care - HMO | Admitting: Physical Therapy

## 2019-10-29 ENCOUNTER — Other Ambulatory Visit: Payer: Self-pay

## 2019-10-29 DIAGNOSIS — Z79899 Other long term (current) drug therapy: Secondary | ICD-10-CM

## 2019-10-29 DIAGNOSIS — F988 Other specified behavioral and emotional disorders with onset usually occurring in childhood and adolescence: Secondary | ICD-10-CM

## 2019-10-29 MED ORDER — AMPHETAMINE-DEXTROAMPHET ER 15 MG PO CP24: 15.0000 mg | ORAL_CAPSULE | ORAL | 0 refills | Status: DC

## 2019-12-02 ENCOUNTER — Other Ambulatory Visit: Payer: Self-pay

## 2019-12-02 DIAGNOSIS — Z79899 Other long term (current) drug therapy: Secondary | ICD-10-CM

## 2019-12-02 DIAGNOSIS — F988 Other specified behavioral and emotional disorders with onset usually occurring in childhood and adolescence: Secondary | ICD-10-CM

## 2019-12-02 MED ORDER — AMPHETAMINE-DEXTROAMPHET ER 15 MG PO CP24: 15.0000 mg | ORAL_CAPSULE | ORAL | 0 refills | Status: DC

## 2020-01-03 ENCOUNTER — Other Ambulatory Visit: Payer: Self-pay | Admitting: Family Medicine

## 2020-01-03 DIAGNOSIS — F988 Other specified behavioral and emotional disorders with onset usually occurring in childhood and adolescence: Secondary | ICD-10-CM

## 2020-01-03 DIAGNOSIS — Z79899 Other long term (current) drug therapy: Secondary | ICD-10-CM

## 2020-01-03 MED ORDER — AMPHETAMINE-DEXTROAMPHET ER 15 MG PO CP24: 15.0000 mg | ORAL_CAPSULE | ORAL | 0 refills | Status: DC

## 2020-01-03 NOTE — Telephone Encounter (Signed)
Ok to refill??  Last office visit 06/04/2019.  Last refill 12/02/2019.

## 2020-01-03 NOTE — Telephone Encounter (Signed)
Refill Adderall   [id number] - [place], [place] - [address]

## 2020-01-10 ENCOUNTER — Ambulatory Visit: Payer: 59 | Admitting: Family Medicine

## 2020-01-17 ENCOUNTER — Other Ambulatory Visit: Payer: Self-pay

## 2020-01-17 ENCOUNTER — Ambulatory Visit (INDEPENDENT_AMBULATORY_CARE_PROVIDER_SITE_OTHER): Payer: 59 | Admitting: Family Medicine

## 2020-01-17 VITALS — BP 120/80 | HR 54 | Temp 98.2°F | Ht 72.0 in | Wt 186.0 lb

## 2020-01-17 DIAGNOSIS — F988 Other specified behavioral and emotional disorders with onset usually occurring in childhood and adolescence: Secondary | ICD-10-CM | POA: Diagnosis not present

## 2020-01-17 DIAGNOSIS — I10 Essential (primary) hypertension: Secondary | ICD-10-CM | POA: Diagnosis not present

## 2020-01-17 MED ORDER — VALSARTAN 160 MG PO TABS
160.0000 mg | ORAL_TABLET | Freq: Every day | ORAL | 3 refills | Status: DC
Start: 2020-01-17 — End: 2021-02-06

## 2020-01-17 NOTE — Progress Notes (Signed)
Subjective:    Patient ID: Zachary Hopkins, male    DOB: 01-15-1986, 34 y.o.   MRN: [id number]  HPI  Patient is a very pleasant 34 year old Caucasian male here today for a follow-up.  He is on losartan 50 mg a day for hypertension.  However he states that the medicine wears off later in the day and that his blood pressure is higher in the afternoons and in the evenings.  Right now it is well controlled.  He denies any chest pain shortness of breath or dyspnea on exertion.  He is also taking Adderall XR 15 mg daily however he also states that this tends to wear off after lunch.  He did better on Vyvanse in the past however he is comfortable continuing the medication at his present dose.  He is not interested in increasing the medicine to twice daily dosage at the present time.  When I last saw him he was taking Protonix twice a day for recalcitrant reflux however, he recently read an article about an association with dementia to proton pump inhibitors.  Therefore he stopped the Protonix and the symptoms have not returned.  He states he has been off the medication for 2 to 3 weeks without any side effects.  I am not concerned about dementia associated with Protonix and I tried to reassure the patient that I do not feel that that is an association that has been proven.  However if he is asymptomatic off the medication I'm fine with him stopping it. Past Medical History:  Diagnosis Date  . ADD (attention deficit disorder)   . Anxiety    takes propranolol priro to public speaking  . GERD (gastroesophageal reflux disease)   . HTN (hypertension)     Current Outpatient Medications on File Prior to Visit  Medication Sig Dispense Refill  . amphetamine-dextroamphetamine (ADDERALL XR) 15 MG 24 hr capsule Take 1 capsule by mouth every morning. 30 capsule 0  . pantoprazole (PROTONIX) 40 MG tablet Take 40 mg by mouth 2 (two) times daily. (Patient not taking: Reported on 01/17/2020)    . propranolol  (INDERAL) 20 MG tablet TAKE 1 TABLET (20 MG TOTAL) BY MOUTH 2 (TWO) TIMES DAILY. AS NEEDED FOR PUBLIC SPEAKING 180 tablet 0   No current facility-administered medications on file prior to visit.   No Known Allergies Social History   Socioeconomic History  . Marital status: Single    Spouse name: Not on file  . Number of children: Not on file  . Years of education: Not on file  . Highest education level: Not on file  Occupational History  . Not on file  Tobacco Use  . Smoking status: Never Smoker  . Smokeless tobacco: Never Used  Substance and Sexual Activity  . Alcohol use: Yes  . Drug use: No  . Sexual activity: Not on file  Other Topics Concern  . Not on file  Social History Narrative  . Not on file   Social Determinants of Health   Financial Resource Strain: Not on file  Food Insecurity: Not on file  Transportation Needs: Not on file  Physical Activity: Not on file  Stress: Not on file  Social Connections: Not on file  Intimate Partner Violence: Not on file   No family history on file.    Review of Systems  All other systems reviewed and are negative.      Objective:   Physical Exam Vitals reviewed.  Constitutional:      General:  He is not in acute distress.    Appearance: Normal appearance. He is normal weight. He is not ill-appearing, toxic-appearing or diaphoretic.  HENT:     Head: Normocephalic and atraumatic.  Neck:     Vascular: No carotid bruit.  Cardiovascular:     Rate and Rhythm: Normal rate and regular rhythm.     Pulses: Normal pulses.     Heart sounds: Normal heart sounds. No murmur heard. No friction rub. No gallop.   Pulmonary:     Effort: Pulmonary effort is normal. No respiratory distress.     Breath sounds: Normal breath sounds. No stridor. No wheezing, rhonchi or rales.  Chest:     Chest wall: No tenderness.  Musculoskeletal:     Cervical back: Normal range of motion and neck supple. No rigidity. No muscular tenderness.      Right lower leg: No edema.     Left lower leg: No edema.  Lymphadenopathy:     Cervical: No cervical adenopathy.  Neurological:     General: No focal deficit present.     Mental Status: He is alert and oriented to person, place, and time. Mental status is at baseline.           Assessment & Plan:  Benign essential HTN - Plan: CBC with Differential/Platelet, COMPLETE METABOLIC PANEL WITH GFR, Lipid panel  Attention deficit disorder (ADD) without hyperactivity   Blood pressure medication is not working sufficiently in the evenings.  Therefore we agreed to discontinue losartan and replace with valsartan 160 mg daily.  Check CBC, CMP, lipid panel.  We discussed switching Adderall to Vyvanse but at the present time he is comfortable continuing his medication at the current dose.  Flu shot and Covid vaccination are up-to-date.  Patient will discontinue Protonix indefinitely unless symptoms return

## 2020-01-18 ENCOUNTER — Telehealth: Payer: Self-pay

## 2020-01-18 LAB — CBC WITH DIFFERENTIAL/PLATELET
Absolute Monocytes: 422 cells/uL (ref 200–950)
Basophils Absolute: 31 cells/uL (ref 0–200)
Basophils Relative: 0.5 %
Eosinophils Absolute: 62 cells/uL (ref 15–500)
Eosinophils Relative: 1 %
HCT: 45.7 % (ref 38.5–50.0)
Hemoglobin: 15.3 g/dL (ref 13.2–17.1)
Lymphs Abs: 2158 cells/uL (ref 850–3900)
MCH: 29.3 pg (ref 27.0–33.0)
MCHC: 33.5 g/dL (ref 32.0–36.0)
MCV: 87.5 fL (ref 80.0–100.0)
MPV: 9.1 fL (ref 7.5–12.5)
Monocytes Relative: 6.8 %
Neutro Abs: 3528 cells/uL (ref 1500–7800)
Neutrophils Relative %: 56.9 %
Platelets: 397 10*3/uL (ref 140–400)
RBC: 5.22 10*6/uL (ref 4.20–5.80)
RDW: 12.3 % (ref 11.0–15.0)
Total Lymphocyte: 34.8 %
WBC: 6.2 10*3/uL (ref 3.8–10.8)

## 2020-01-18 LAB — LIPID PANEL
Cholesterol: 126 mg/dL (ref <200)
HDL: 75 mg/dL (ref >=40)
LDL Cholesterol (Calc): 39 mg/dL (calc)
Non-HDL Cholesterol (Calc): 51 mg/dL (calc) (ref <130)
Total CHOL/HDL Ratio: 1.7 (calc) (ref <5.0)
Triglycerides: 41 mg/dL (ref <150)

## 2020-01-18 LAB — COMPLETE METABOLIC PANEL WITH GFR
AG Ratio: 1.9 (calc) (ref 1.0–2.5)
ALT: 31 U/L (ref 9–46)
AST: 21 U/L (ref 10–40)
Albumin: 4.8 g/dL (ref 3.6–5.1)
Alkaline phosphatase (APISO): 93 U/L (ref 36–130)
BUN: 16 mg/dL (ref 7–25)
CO2: 30 mmol/L (ref 20–32)
Calcium: 10.5 mg/dL — ABNORMAL HIGH (ref 8.6–10.3)
Chloride: 103 mmol/L (ref 98–110)
Creat: 0.92 mg/dL (ref 0.60–1.35)
GFR, Est African American: 127 mL/min/{1.73_m2} (ref >=60)
GFR, Est Non African American: 110 mL/min/{1.73_m2} (ref >=60)
Globulin: 2.5 g/dL (calc) (ref 1.9–3.7)
Glucose, Bld: 102 mg/dL — ABNORMAL HIGH (ref 65–99)
Potassium: 4.9 mmol/L (ref 3.5–5.3)
Sodium: 139 mmol/L (ref 135–146)
Total Bilirubin: 0.6 mg/dL (ref 0.2–1.2)
Total Protein: 7.3 g/dL (ref 6.1–8.1)

## 2020-01-18 NOTE — Telephone Encounter (Signed)
Zachary Hopkins is asking what is the risk of his Blood sugar being slightly elevated?

## 2020-01-18 NOTE — Telephone Encounter (Signed)
I do not feel 102 is significant. It could be due to any food or drink he may have consumed that morning if he did.  Prediabetes is >112 and diabetes is greater than 126 so this is something I would simply monitor moving forward.

## 2020-01-19 ENCOUNTER — Telehealth: Payer: Self-pay

## 2020-01-19 ENCOUNTER — Other Ambulatory Visit: Payer: Self-pay

## 2020-01-19 ENCOUNTER — Telehealth: Payer: Self-pay | Admitting: Family Medicine

## 2020-01-19 DIAGNOSIS — Z833 Family history of diabetes mellitus: Secondary | ICD-10-CM

## 2020-01-19 NOTE — Telephone Encounter (Signed)
Pt calling for his lab results concerning about his A1C level

## 2020-01-19 NOTE — Telephone Encounter (Signed)
Pt called with concern of glucose levels being 3 pt out of normal range. Requesting a1c to be done due to family hx of dm. Lab pended for future draw

## 2020-01-21 ENCOUNTER — Telehealth: Payer: Self-pay

## 2020-01-21 NOTE — Telephone Encounter (Signed)
Patient called for lab work to be mail out to him.

## 2020-02-07 ENCOUNTER — Other Ambulatory Visit: Payer: Self-pay

## 2020-02-07 DIAGNOSIS — Z79899 Other long term (current) drug therapy: Secondary | ICD-10-CM

## 2020-02-07 DIAGNOSIS — F988 Other specified behavioral and emotional disorders with onset usually occurring in childhood and adolescence: Secondary | ICD-10-CM

## 2020-02-07 MED ORDER — AMPHETAMINE-DEXTROAMPHET ER 15 MG PO CP24: 15.0000 mg | ORAL_CAPSULE | ORAL | 0 refills | Status: DC

## 2020-02-07 NOTE — Telephone Encounter (Signed)
Ok to refill??  Last office visit 01/17/2020.  Last refill 01/03/2020.

## 2020-02-07 NOTE — Telephone Encounter (Signed)
Pt needs refill onamphetamine-dextroamphetamine (ADDERALL XR) 15 MG 24 hr capsule [[id number]]  [id number] - [place], [place] - [address]   Pt call back [phone]

## 2020-02-09 ENCOUNTER — Ambulatory Visit: Payer: Commercial Managed Care - HMO | Admitting: Family Medicine

## 2020-02-17 ENCOUNTER — Telehealth: Payer: Self-pay | Admitting: *Deleted

## 2020-02-17 NOTE — Telephone Encounter (Signed)
Received call from patient.   Reports that he would like to change ADHD medication to Vyvanse as discussed at last visit.   Please advise.

## 2020-02-18 ENCOUNTER — Other Ambulatory Visit: Payer: Self-pay | Admitting: Family Medicine

## 2020-02-18 MED ORDER — LISDEXAMFETAMINE DIMESYLATE 40 MG PO CAPS: 40.0000 mg | ORAL_CAPSULE | ORAL | 0 refills | Status: DC

## 2020-02-18 NOTE — Telephone Encounter (Signed)
Call placed to patient and patient made aware via VM. 

## 2020-02-18 NOTE — Telephone Encounter (Signed)
[facility]d [facility], stop adderall.

## 2020-03-20 ENCOUNTER — Other Ambulatory Visit: Payer: Self-pay | Admitting: Family Medicine

## 2020-03-20 DIAGNOSIS — F988 Other specified behavioral and emotional disorders with onset usually occurring in childhood and adolescence: Secondary | ICD-10-CM

## 2020-03-20 DIAGNOSIS — Z79899 Other long term (current) drug therapy: Secondary | ICD-10-CM

## 2020-03-20 MED ORDER — AMPHETAMINE-DEXTROAMPHET ER 15 MG PO CP24: 15.0000 mg | ORAL_CAPSULE | ORAL | 0 refills | Status: DC

## 2020-03-20 NOTE — Telephone Encounter (Signed)
Refill Vyanse

## 2020-03-21 ENCOUNTER — Other Ambulatory Visit: Payer: Self-pay

## 2020-03-21 MED ORDER — LISDEXAMFETAMINE DIMESYLATE 40 MG PO CAPS: 40.0000 mg | ORAL_CAPSULE | ORAL | 0 refills | Status: DC

## 2020-04-04 NOTE — Telephone Encounter (Signed)
No action needed at this time, closing open encounter 

## 2020-04-27 ENCOUNTER — Other Ambulatory Visit: Payer: Self-pay | Admitting: Family Medicine

## 2020-04-27 DIAGNOSIS — F988 Other specified behavioral and emotional disorders with onset usually occurring in childhood and adolescence: Secondary | ICD-10-CM

## 2020-04-27 DIAGNOSIS — Z79899 Other long term (current) drug therapy: Secondary | ICD-10-CM

## 2020-04-27 NOTE — Telephone Encounter (Signed)
Ok to refill??  Last office visit 01/17/2020.  Last refill 03/20/2020.

## 2020-04-27 NOTE — Telephone Encounter (Signed)
Pt called needing a refill of  lisdexamfetamine (VYVANSE) 40 MG capsule  Cb#: [phone]

## 2020-04-28 MED ORDER — AMPHETAMINE-DEXTROAMPHET ER 15 MG PO CP24: 15.0000 mg | ORAL_CAPSULE | ORAL | 0 refills | Status: DC

## 2020-05-03 ENCOUNTER — Other Ambulatory Visit: Payer: Self-pay | Admitting: Family Medicine

## 2020-05-03 DIAGNOSIS — F988 Other specified behavioral and emotional disorders with onset usually occurring in childhood and adolescence: Secondary | ICD-10-CM

## 2020-05-03 DIAGNOSIS — Z79899 Other long term (current) drug therapy: Secondary | ICD-10-CM

## 2020-05-03 NOTE — Telephone Encounter (Signed)
Pt called in needing a refill of  lisdexamfetamine (VYVANSE) 40 MG capsule [[id number]]  Sent to [id number] - [place], [place] - [address]  Cb#: [phone]

## 2020-05-04 MED ORDER — AMPHETAMINE-DEXTROAMPHET ER 15 MG PO CP24: 15.0000 mg | ORAL_CAPSULE | ORAL | 0 refills | Status: DC

## 2020-05-08 ENCOUNTER — Other Ambulatory Visit: Payer: Self-pay | Admitting: *Deleted

## 2020-05-08 MED ORDER — LISDEXAMFETAMINE DIMESYLATE 40 MG PO CAPS: 40.0000 mg | ORAL_CAPSULE | ORAL | 0 refills | Status: DC

## 2020-05-08 NOTE — Telephone Encounter (Signed)
Received call from patient.   Requested refill on Vyvanse.   Ok to refill??  Last office visit 01/17/2020.  Last refill 03/21/2020.

## 2020-07-04 ENCOUNTER — Other Ambulatory Visit: Payer: Self-pay | Admitting: Family Medicine

## 2020-07-04 MED ORDER — LISDEXAMFETAMINE DIMESYLATE 40 MG PO CAPS: 40.0000 mg | ORAL_CAPSULE | ORAL | 0 refills | Status: DC

## 2020-07-04 NOTE — Telephone Encounter (Signed)
Patient called to request refill of lisdexamfetamine (VYVANSE) 40 MG capsule [[id number]]    Pharmacy confirmed as  [id number] - [place], [place] - [address]  [address], [place] [address]  Phone:  [phone] Fax:  [phone]  DEA #:  [id number]  Patient took last pill 06/30/2020.   Please advise patient at [phone].

## 2020-07-07 ENCOUNTER — Other Ambulatory Visit: Payer: Self-pay

## 2020-07-07 ENCOUNTER — Telehealth: Payer: Self-pay | Admitting: Family Medicine

## 2020-07-07 MED ORDER — PANTOPRAZOLE SODIUM 40 MG PO TBEC
40.0000 mg | DELAYED_RELEASE_TABLET | Freq: Two times a day (BID) | ORAL | 0 refills | Status: DC
Start: 2020-07-07 — End: 2020-07-31

## 2020-07-07 NOTE — Telephone Encounter (Signed)
Medication filled x1 with no refills.   Requires office visit before any further refills can be given.   Letter sent.  

## 2020-07-07 NOTE — Telephone Encounter (Signed)
Pt called in asking for a refill of med  pantoprazole (PROTONIX) 40 MG tablet [[id number]]  Sent to pharmacy.  Cb#: [phone]

## 2020-07-31 ENCOUNTER — Other Ambulatory Visit: Payer: Self-pay | Admitting: Family Medicine

## 2020-08-08 ENCOUNTER — Other Ambulatory Visit: Payer: Self-pay

## 2020-08-08 ENCOUNTER — Telehealth: Payer: Self-pay | Admitting: Family Medicine

## 2020-08-08 MED ORDER — LISDEXAMFETAMINE DIMESYLATE 40 MG PO CAPS: 40.0000 mg | ORAL_CAPSULE | ORAL | 0 refills | Status: DC

## 2020-08-08 NOTE — Telephone Encounter (Signed)
Message sent to pcp for approval. 

## 2020-08-08 NOTE — Telephone Encounter (Signed)
Patient called in requesting refill on [facility]se [facility] [place]  [place] [phone]

## 2020-08-18 ENCOUNTER — Other Ambulatory Visit: Payer: Self-pay

## 2020-08-18 ENCOUNTER — Encounter: Payer: Self-pay | Admitting: Family Medicine

## 2020-08-18 ENCOUNTER — Ambulatory Visit: Payer: 59 | Admitting: Family Medicine

## 2020-08-18 VITALS — BP 122/64 | HR 66 | Temp 98.5°F | Resp 14 | Ht 72.0 in | Wt 181.0 lb

## 2020-08-18 DIAGNOSIS — I10 Essential (primary) hypertension: Secondary | ICD-10-CM | POA: Diagnosis not present

## 2020-08-18 LAB — COMPLETE METABOLIC PANEL WITH GFR
AG Ratio: 2.2 (calc) (ref 1.0–2.5)
ALT: 23 U/L (ref 9–46)
AST: 17 U/L (ref 10–40)
Albumin: 4.8 g/dL (ref 3.6–5.1)
Alkaline phosphatase (APISO): 52 U/L (ref 36–130)
BUN: 21 mg/dL (ref 7–25)
CO2: 28 mmol/L (ref 20–32)
Calcium: 9.7 mg/dL (ref 8.6–10.3)
Chloride: 103 mmol/L (ref 98–110)
Creat: 0.98 mg/dL (ref 0.60–1.26)
Globulin: 2.2 g/dL (calc) (ref 1.9–3.7)
Glucose, Bld: 98 mg/dL (ref 65–99)
Potassium: 4.5 mmol/L (ref 3.5–5.3)
Sodium: 138 mmol/L (ref 135–146)
Total Bilirubin: 1.1 mg/dL (ref 0.2–1.2)
Total Protein: 7 g/dL (ref 6.1–8.1)
eGFR: 104 mL/min/{1.73_m2} (ref >=60)

## 2020-08-18 LAB — CBC WITH DIFFERENTIAL/PLATELET
Absolute Monocytes: 396 cells/uL (ref 200–950)
Basophils Absolute: 50 cells/uL (ref 0–200)
Basophils Relative: 0.9 %
Eosinophils Absolute: 138 cells/uL (ref 15–500)
Eosinophils Relative: 2.5 %
HCT: 48.8 % (ref 38.5–50.0)
Hemoglobin: 15.9 g/dL (ref 13.2–17.1)
Lymphs Abs: 2173 cells/uL (ref 850–3900)
MCH: 29.1 pg (ref 27.0–33.0)
MCHC: 32.6 g/dL (ref 32.0–36.0)
MCV: 89.2 fL (ref 80.0–100.0)
MPV: 9.3 fL (ref 7.5–12.5)
Monocytes Relative: 7.2 %
Neutro Abs: 2745 cells/uL (ref 1500–7800)
Neutrophils Relative %: 49.9 %
Platelets: 325 10*3/uL (ref 140–400)
RBC: 5.47 10*6/uL (ref 4.20–5.80)
RDW: 13 % (ref 11.0–15.0)
Total Lymphocyte: 39.5 %
WBC: 5.5 10*3/uL (ref 3.8–10.8)

## 2020-08-18 LAB — LIPID PANEL
Cholesterol: 162 mg/dL (ref <200)
HDL: 68 mg/dL (ref >=40)
LDL Cholesterol (Calc): 81 mg/dL (calc)
Non-HDL Cholesterol (Calc): 94 mg/dL (calc) (ref <130)
Total CHOL/HDL Ratio: 2.4 (calc) (ref <5.0)
Triglycerides: 58 mg/dL (ref <150)

## 2020-08-18 NOTE — Progress Notes (Signed)
Subjective:    Patient ID: Zachary Hopkins, male    DOB: 1985/04/29, 35 y.o.   MRN: [id number]  HPI  Patient is a very nice 35 year old Caucasian gentleman who is here today at our request for refill on his Vyvanse.  He states that he is not having any issues.  He is taking valsartan for hypertension.  His blood pressure here today is 122/64.  However he states that occasionally in the late afternoons he feels like the medication is wearing off prematurely and his blood pressure can occasionally be higher.  He denies any chest pain or shortness of breath or dyspnea on exertion.  He feels that the Vyvanse is working great for him.  He denies any issues with maintaining focus or concentration at work.  He denies any palpitations.  He denies any tachycardia.  He denies any issues with anxiety related to the medication. Past Medical History:  Diagnosis Date   ADD (attention deficit disorder)    Anxiety    takes propranolol priro to public speaking   GERD (gastroesophageal reflux disease)    HTN (hypertension)     Current Outpatient Medications on File Prior to Visit  Medication Sig Dispense Refill   lisdexamfetamine (VYVANSE) 40 MG capsule Take 1 capsule (40 mg total) by mouth every morning. 30 capsule 0   pantoprazole (PROTONIX) 40 MG tablet TAKE 1 TABLET BY MOUTH TWICE A DAY 60 tablet 1   propranolol (INDERAL) 20 MG tablet TAKE 1 TABLET (20 MG TOTAL) BY MOUTH 2 (TWO) TIMES DAILY. AS NEEDED FOR PUBLIC SPEAKING 180 tablet 0   valsartan (DIOVAN) 160 MG tablet Take 1 tablet (160 mg total) by mouth daily. 90 tablet 3   No current facility-administered medications on file prior to visit.   No Known Allergies Social History   Socioeconomic History   Marital status: Single    Spouse name: Not on file   Number of children: Not on file   Years of education: Not on file   Highest education level: Not on file  Occupational History   Not on file  Tobacco Use   Smoking status: Never    Smokeless tobacco: Never  Substance and Sexual Activity   Alcohol use: Yes   Drug use: No   Sexual activity: Not on file  Other Topics Concern   Not on file  Social History Narrative   Not on file   Social Determinants of Health   Financial Resource Strain: Not on file  Food Insecurity: Not on file  Transportation Needs: Not on file  Physical Activity: Not on file  Stress: Not on file  Social Connections: Not on file  Intimate Partner Violence: Not on file   History reviewed. No pertinent family history.    Review of Systems     Objective:   Physical Exam Vitals reviewed.  Constitutional:      General: He is not in acute distress.    Appearance: Normal appearance. He is normal weight. He is not ill-appearing, toxic-appearing or diaphoretic.  HENT:     Head: Normocephalic and atraumatic.  Neck:     Vascular: No carotid bruit.  Cardiovascular:     Rate and Rhythm: Normal rate and regular rhythm.     Pulses: Normal pulses.     Heart sounds: Normal heart sounds. No murmur heard.   No friction rub. No gallop.  Pulmonary:     Effort: Pulmonary effort is normal. No respiratory distress.     Breath sounds: Normal  breath sounds. No stridor. No wheezing, rhonchi or rales.  Chest:     Chest wall: No tenderness.  Musculoskeletal:     Cervical back: Normal range of motion and neck supple. No rigidity.     Right lower leg: No edema.     Left lower leg: No edema.  Lymphadenopathy:     Cervical: No cervical adenopathy.  Neurological:     General: No focal deficit present.     Mental Status: He is alert and oriented to person, place, and time. Mental status is at baseline.          Assessment & Plan:  Benign essential HTN - Plan: CBC with Differential/Platelet, COMPLETE METABOLIC PANEL WITH GFR, Lipid panel  Blood pressure today is outstanding.  While the patient is here I will check a CBC CMP and a lipid panel.  On his last lab work his glucose was mildly elevated at  102 and his calcium was mildly elevated at 10.5.  I want to follow this up as I believe that this was a one-time fluke.  The Vyvanse seems to be working well and I will happily refill it whenever the patient needs it again.

## 2020-08-21 ENCOUNTER — Encounter: Payer: Self-pay | Admitting: *Deleted

## 2020-08-28 ENCOUNTER — Telehealth: Payer: Self-pay | Admitting: Family Medicine

## 2020-08-28 NOTE — Telephone Encounter (Signed)
Please see labs for further recommendations.

## 2020-08-28 NOTE — Telephone Encounter (Signed)
Patient left voice message to request call back for lab results. Please advise at [phone].

## 2020-08-31 ENCOUNTER — Other Ambulatory Visit: Payer: Self-pay | Admitting: Family Medicine

## 2020-09-06 ENCOUNTER — Other Ambulatory Visit: Payer: Self-pay

## 2020-09-06 NOTE — Telephone Encounter (Signed)
Pt called in requesting a refill of lisdexamfetamine (V[facility]VANSE) [facility]  Cb#: [phone]

## 2020-09-06 NOTE — Telephone Encounter (Signed)
Ok to refill??  Last office visit 08/18/2020.  Last refill 08/08/2020.

## 2020-09-07 ENCOUNTER — Telehealth: Payer: Self-pay | Admitting: Family Medicine

## 2020-09-07 MED ORDER — LISDEXAMFETAMINE DIMESYLATE 40 MG PO CAPS: 40.0000 mg | ORAL_CAPSULE | ORAL | 0 refills | Status: DC

## 2020-09-07 NOTE — Telephone Encounter (Signed)
Patient called to follow up on refill for Vyvanse; received call from pharmacy stating prior auth required. Please advise at [phone]

## 2020-09-08 ENCOUNTER — Other Ambulatory Visit: Payer: Self-pay | Admitting: Family Medicine

## 2020-09-08 MED ORDER — AMPHETAMINE-DEXTROAMPHET ER 10 MG PO CP24: 10.0000 mg | ORAL_CAPSULE | Freq: Two times a day (BID) | ORAL | 0 refills | Status: DC

## 2020-09-08 NOTE — Telephone Encounter (Addendum)
Received call from patient.   States that co-pay for Vyvanse is now $300.  Requested to change back to Adderall.   Of note, reports that Adderall was changed to Vyvanse as it was wearing off in the early afternoon.   Requested to have (2) smaller doses for daytime focus if able.

## 2020-09-08 NOTE — Telephone Encounter (Signed)
Call placed to patient and patient made aware.  

## 2020-09-08 NOTE — Telephone Encounter (Signed)
Received request from pharmacy for PA on Vyvanse.   PA submitted.   Dx: F98.8- ADD  Received immediate determination.   [id number] approved through 09/08/2021.  Patient and pharmacy made aware.

## 2020-09-13 ENCOUNTER — Other Ambulatory Visit: Payer: Self-pay

## 2020-09-27 ENCOUNTER — Telehealth: Payer: Self-pay | Admitting: *Deleted

## 2020-09-27 NOTE — Telephone Encounter (Signed)
Received request from pharmacy for PA on Adderall XR BID dosing.   PA submitted.   Dx: B01.7- ADD.  [facility] is reviewing your PA request. Typically an electronic response will be received within 24-72 hours.

## 2020-09-27 NOTE — Addendum Note (Signed)
Addended by: Liskey Odor on: 09/27/2020 05:58 PM   Modules accepted: Orders

## 2020-09-27 NOTE — Telephone Encounter (Signed)
Received PA determination.   [id number] approved through 09/27/2021.  Patient paid out of pocket for 15 day supply on 09/08/2020.  Please refill.

## 2020-09-28 ENCOUNTER — Other Ambulatory Visit: Payer: Self-pay | Admitting: Family Medicine

## 2020-09-28 MED ORDER — AMPHETAMINE-DEXTROAMPHET ER 10 MG PO CP24: 10.0000 mg | ORAL_CAPSULE | Freq: Two times a day (BID) | ORAL | 0 refills | Status: DC

## 2020-09-28 NOTE — Telephone Encounter (Signed)
The PA was approved for the XR 10 mg BID.   He just needs a refill now.

## 2020-10-02 ENCOUNTER — Other Ambulatory Visit: Payer: Self-pay | Admitting: Family Medicine

## 2020-10-30 ENCOUNTER — Other Ambulatory Visit: Payer: Self-pay | Admitting: *Deleted

## 2020-10-30 NOTE — Telephone Encounter (Signed)
Received call from patient.   Requested refill on Adderall.   Ok to refill??  Last office visit 08/18/2020.  Last refill 09/28/2020.

## 2020-10-31 MED ORDER — AMPHETAMINE-DEXTROAMPHET ER 10 MG PO CP24: 10.0000 mg | ORAL_CAPSULE | Freq: Two times a day (BID) | ORAL | 0 refills | Status: DC

## 2020-11-06 ENCOUNTER — Other Ambulatory Visit: Payer: Self-pay | Admitting: Family Medicine

## 2020-11-28 ENCOUNTER — Other Ambulatory Visit: Payer: Self-pay | Admitting: *Deleted

## 2020-11-28 MED ORDER — AMPHETAMINE-DEXTROAMPHET ER 10 MG PO CP24: 10.0000 mg | ORAL_CAPSULE | Freq: Two times a day (BID) | ORAL | 0 refills | Status: DC

## 2020-11-28 NOTE — Telephone Encounter (Signed)
Received call from patient.   Requested refill on Adderall.   Ok to refill??  Last office visit 08/18/2020.  Last refill 10/31/2020.

## 2020-11-29 ENCOUNTER — Other Ambulatory Visit: Payer: Self-pay | Admitting: Family Medicine

## 2020-12-22 ENCOUNTER — Other Ambulatory Visit: Payer: Self-pay | Admitting: Family Medicine

## 2021-01-01 ENCOUNTER — Other Ambulatory Visit: Payer: Self-pay

## 2021-01-01 MED ORDER — AMPHETAMINE-DEXTROAMPHET ER 10 MG PO CP24: 10.0000 mg | ORAL_CAPSULE | Freq: Two times a day (BID) | ORAL | 0 refills | Status: DC

## 2021-01-01 NOTE — Telephone Encounter (Signed)
LOV 08/18/20 Last refill 11/29/20 #60, 0 refills  Please review, thanks!

## 2021-02-02 ENCOUNTER — Other Ambulatory Visit: Payer: Self-pay | Admitting: Family Medicine

## 2021-02-02 ENCOUNTER — Telehealth: Payer: Self-pay

## 2021-02-02 MED ORDER — AMPHETAMINE-DEXTROAMPHET ER 10 MG PO CP24: 10.0000 mg | ORAL_CAPSULE | Freq: Two times a day (BID) | ORAL | 0 refills | Status: DC

## 2021-02-02 NOTE — Telephone Encounter (Signed)
Patient called in requesting a refill for Adderall XR. Paitent's last ov was 08/18/2020. Please advise

## 2021-02-06 ENCOUNTER — Other Ambulatory Visit: Payer: Self-pay

## 2021-02-06 MED ORDER — VALSARTAN 160 MG PO TABS: 160.0000 mg | ORAL_TABLET | Freq: Every day | ORAL | 3 refills | Status: DC

## 2021-03-06 ENCOUNTER — Other Ambulatory Visit: Payer: Self-pay

## 2021-03-06 MED ORDER — AMPHETAMINE-DEXTROAMPHET ER 10 MG PO CP24: 10.0000 mg | ORAL_CAPSULE | Freq: Two times a day (BID) | ORAL | 0 refills | Status: DC

## 2021-03-06 NOTE — Telephone Encounter (Signed)
Pt called in to request a refill of amphetamine-dextroamphetamine (ADDERALL XR) 10 MG 24 hr capsule. Please advise  Cb#: [phone]

## 2021-03-06 NOTE — Telephone Encounter (Signed)
[facility]. He will call us back if they do not.   LOV 08/18/20 Last refill 02/02/21, #30, 0 refills  Please review, thanks!

## 2021-04-10 ENCOUNTER — Other Ambulatory Visit: Payer: Self-pay | Admitting: Family Medicine

## 2021-04-10 NOTE — Telephone Encounter (Signed)
LOV 08/18/20 ?Last refill 03/06/21, #60, 0 refills ? ?Please review. Thanks! ?

## 2021-04-10 NOTE — Telephone Encounter (Signed)
Patient called to request refill of ? ?amphetamine-dextroamphetamine (ADDERALL XR) 10 MG 24 hr capsule [[id number]]  ? ?Pharmacy confirmed as ? ?[id number] - [place], [place] - [phone]  ?[address], [place] [address]  ?Phone:  [phone]  Fax:  [phone]  ? ?Please advise at [phone]. ?

## 2021-04-11 MED ORDER — AMPHETAMINE-DEXTROAMPHET ER 10 MG PO CP24
10.0000 mg | ORAL_CAPSULE | Freq: Two times a day (BID) | ORAL | 0 refills | Status: DC
Start: 2021-04-11 — End: 2021-05-24

## 2021-05-24 ENCOUNTER — Other Ambulatory Visit: Payer: Self-pay

## 2021-05-24 MED ORDER — AMPHETAMINE-DEXTROAMPHET ER 10 MG PO CP24: 10.0000 mg | ORAL_CAPSULE | Freq: Two times a day (BID) | ORAL | 0 refills | Status: DC

## 2021-05-24 NOTE — Telephone Encounter (Signed)
LOV 08/18/20 ?Last refill 04/11/21, #60, 0 refills ? ?Please review, thanks! ? ? ?

## 2021-07-18 ENCOUNTER — Other Ambulatory Visit: Payer: Self-pay

## 2021-07-18 NOTE — Telephone Encounter (Signed)
Please call pt to make an CPE appt for medication refill. LOV 08/18/20

## 2021-07-18 NOTE — Telephone Encounter (Signed)
Pt called in requesting a refill of amphetamine-dextroamphetamine (ADDERALL XR) 10 MG 24 hr capsule [facility] on [address]. Please advise.  Cb#: [phone]

## 2021-07-18 NOTE — Telephone Encounter (Signed)
Requested medication (s) are due for refill today:   Provider to review  Requested medication (s) are on the active medication list:   Yes  Future visit scheduled:   No   Last ordered: 05/24/2021 #60, 0 refill  Returned because it's a non delegated refill per the protocol.  Requested Prescriptions  Pending Prescriptions Disp Refills   amphetamine-dextroamphetamine (ADDERALL XR) 10 MG 24 hr capsule 60 capsule 0    Sig: Take 1 capsule (10 mg total) by mouth in the morning and at bedtime.     Not Delegated - Psychiatry:  Stimulants/ADHD Failed - 07/18/2021  9:39 AM      Failed - This refill cannot be delegated      Failed - Urine Drug Screen completed in last 360 days      Failed - Valid encounter within last 6 months    Recent Outpatient Visits           11 months ago Benign essential HTN   [facility] Pickard, Priscille Heidelberg, MD   1 year ago Benign essential HTN   [facility] Donita Brooks, MD   2 years ago Benign essential HTN   [facility] Tanya Nones, Priscille Heidelberg, MD   2 years ago Encounter for medication review   [facility] Elmore Guise, FNP   2 years ago General medical exam   [facility] Jenne Pane, Crystal A, FNP              Passed - Last BP in normal range    BP Readings from Last 1 Encounters:  08/18/20 122/64         Passed - Last Heart Rate in normal range    Pulse Readings from Last 1 Encounters:  08/18/20 66

## 2021-07-20 NOTE — Telephone Encounter (Signed)
Requested medication (s) are due for refill today: yes  Requested medication (s) are on the active medication list: yes  Last refill:  05/24/21 #60 0 refills  Future visit scheduled: yes in 3 wks  Notes to clinic:  not delegated per protocol. Can patient get courtesy refill? Enough to last until future visit?     Requested Prescriptions  Pending Prescriptions Disp Refills   amphetamine-dextroamphetamine (ADDERALL XR) 10 MG 24 hr capsule 60 capsule 0    Sig: Take 1 capsule (10 mg total) by mouth in the morning and at bedtime.     Not Delegated - Psychiatry:  Stimulants/ADHD Failed - 07/20/2021  9:39 AM      Failed - This refill cannot be delegated      Failed - Urine Drug Screen completed in last 360 days      Failed - Valid encounter within last 6 months    Recent Outpatient Visits           11 months ago Benign essential HTN   [facility] Pickard, Priscille Heidelberg, MD   1 year ago Benign essential HTN   [facility] Donita Brooks, MD   2 years ago Benign essential HTN   [facility] Tanya Nones, Priscille Heidelberg, MD   2 years ago Encounter for medication review   [facility] Elmore Guise, FNP   2 years ago General medical exam   [facility] Elmore Guise, FNP       Future Appointments             In 3 weeks Tanya Nones, Priscille Heidelberg, MD [facility]            Passed - Last BP in normal range    BP Readings from Last 1 Encounters:  08/18/20 122/64         Passed - Last Heart Rate in normal range    Pulse Readings from Last 1 Encounters:  08/18/20 66

## 2021-07-23 MED ORDER — AMPHETAMINE-DEXTROAMPHET ER 10 MG PO CP24: 10.0000 mg | ORAL_CAPSULE | Freq: Two times a day (BID) | ORAL | 0 refills | Status: AC

## 2021-07-23 NOTE — Telephone Encounter (Signed)
Pt has OV appt on 08/03/21  LOV 08/18/20 Last refill 05/24/21, #60, 0 refills  Please review, thanks!

## 2021-08-03 ENCOUNTER — Other Ambulatory Visit: Payer: Commercial Managed Care - HMO

## 2021-08-03 DIAGNOSIS — I10 Essential (primary) hypertension: Secondary | ICD-10-CM

## 2021-08-03 DIAGNOSIS — Z833 Family history of diabetes mellitus: Secondary | ICD-10-CM

## 2021-08-04 LAB — COMPREHENSIVE METABOLIC PANEL
AG Ratio: 1.9 (calc) (ref 1.0–2.5)
ALT: 36 U/L (ref 9–46)
AST: 29 U/L (ref 10–40)
Albumin: 4.7 g/dL (ref 3.6–5.1)
Alkaline phosphatase (APISO): 63 U/L (ref 36–130)
BUN/Creatinine Ratio: 26 (calc) — ABNORMAL HIGH (ref 6–22)
BUN: 26 mg/dL — ABNORMAL HIGH (ref 7–25)
CO2: 26 mmol/L (ref 20–32)
Calcium: 9.5 mg/dL (ref 8.6–10.3)
Chloride: 103 mmol/L (ref 98–110)
Creat: 1.01 mg/dL (ref 0.60–1.26)
Globulin: 2.5 g/dL (calc) (ref 1.9–3.7)
Glucose, Bld: 93 mg/dL (ref 65–99)
Potassium: 4.6 mmol/L (ref 3.5–5.3)
Sodium: 137 mmol/L (ref 135–146)
Total Bilirubin: 1 mg/dL (ref 0.2–1.2)
Total Protein: 7.2 g/dL (ref 6.1–8.1)

## 2021-08-04 LAB — CBC WITH DIFFERENTIAL/PLATELET
Absolute Monocytes: 437 cells/uL (ref 200–950)
Basophils Absolute: 41 cells/uL (ref 0–200)
Basophils Relative: 0.7 %
Eosinophils Absolute: 112 cells/uL (ref 15–500)
Eosinophils Relative: 1.9 %
HCT: 48.2 % (ref 38.5–50.0)
Hemoglobin: 15.9 g/dL (ref 13.2–17.1)
Lymphs Abs: 2372 cells/uL (ref 850–3900)
MCH: 29.5 pg (ref 27.0–33.0)
MCHC: 33 g/dL (ref 32.0–36.0)
MCV: 89.4 fL (ref 80.0–100.0)
MPV: 9.4 fL (ref 7.5–12.5)
Monocytes Relative: 7.4 %
Neutro Abs: 2938 cells/uL (ref 1500–7800)
Neutrophils Relative %: 49.8 %
Platelets: 314 10*3/uL (ref 140–400)
RBC: 5.39 10*6/uL (ref 4.20–5.80)
RDW: 13.1 % (ref 11.0–15.0)
Total Lymphocyte: 40.2 %
WBC: 5.9 10*3/uL (ref 3.8–10.8)

## 2021-08-04 LAB — LIPID PANEL
Cholesterol: 151 mg/dL (ref <200)
HDL: 72 mg/dL (ref >=40)
LDL Cholesterol (Calc): 65 mg/dL (calc)
Non-HDL Cholesterol (Calc): 79 mg/dL (calc) (ref <130)
Total CHOL/HDL Ratio: 2.1 (calc) (ref <5.0)
Triglycerides: 49 mg/dL (ref <150)

## 2021-08-13 ENCOUNTER — Encounter: Payer: Self-pay | Admitting: Family Medicine

## 2021-08-13 ENCOUNTER — Ambulatory Visit (INDEPENDENT_AMBULATORY_CARE_PROVIDER_SITE_OTHER): Payer: 59 | Admitting: Family Medicine

## 2021-08-13 VITALS — BP 116/62 | HR 52 | Temp 97.6°F | Ht 73.0 in | Wt 195.0 lb

## 2021-08-13 DIAGNOSIS — Z0000 Encounter for general adult medical examination without abnormal findings: Secondary | ICD-10-CM | POA: Diagnosis not present

## 2021-08-13 NOTE — Progress Notes (Signed)
Subjective:    Patient ID: Zachary Hopkins, male    DOB: 1985-09-30, 36 y.o.   MRN: [id number]  HPI  Patient is a very nice 36 year old Caucasian gentleman who is here today for CPE.  He denies any medical concerns.  He stopped taking Adderall about 2 to 3 weeks ago.  He does not like feeling dependent on the medication.  The medicine certainly helps him focus.  However he would like to try to wean away from the medicine and not be dependent on it later in life particularly with his high blood pressure.  The patient is working as a Engineer, civil (consulting).  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  He is better able to maintain focus in this career path.  He certainly notices a change after stopping the medication.  It is hard for him to focus.  He finds himself more easily distracted.  He has to spend more time completing his books and managing his finances but he feels like he can compensate for this.  His blood pressure is excellent 116/62.  His most recent lab work is listed below Lab on 08/03/2021  Component Date Value Ref Range Status  . Cholesterol 08/03/2021 151  <200 mg/dL Final  . HDL 67/61/9509 72  > OR = 40 mg/dL Final  . Triglycerides 08/03/2021 49  <150 mg/dL Final  . LDL Cholesterol (Calc) 08/03/2021 65  mg/dL (calc) Final   Comment: Reference range: <100 . Desirable range <100 mg/dL for primary prevention;   <70 mg/dL for patients with CHD or diabetic patients  with > or = 2 CHD risk factors. Marland Kitchen LDL-C is now calculated using the Martin-Hopkins  calculation, which is a validated novel method providing  better accuracy than the Friedewald equation in the  estimation of LDL-C.  Horald Pollen et al. Lenox Ahr. 3267;124(58): 2061-2068  (http://education.[facility].com/faq/FAQ164)   . Total CHOL/HDL Ratio 08/03/2021 2.1  <0.9 (calc) Final  . Non-HDL Cholesterol (Calc) 08/03/2021 79  <130 mg/dL (calc) Final   Comment: For patients with  diabetes plus 1 major ASCVD risk  factor, treating to a non-HDL-C goal of <100 mg/dL  (LDL-C of <98 mg/dL) is considered a therapeutic  option.   . WBC 08/03/2021 5.9  3.8 - 10.8 Thousand/uL Final  . RBC 08/03/2021 5.39  4.20 - 5.80 Million/uL Final  . Hemoglobin 08/03/2021 15.9  13.2 - 17.1 g/dL Final  . HCT 33/82/5053 48.2  38.5 - 50.0 % Final  . MCV 08/03/2021 89.4  80.0 - 100.0 fL Final  . MCH 08/03/2021 29.5  27.0 - 33.0 pg Final  . MCHC 08/03/2021 33.0  32.0 - 36.0 g/dL Final  . RDW 97/67/3419 13.1  11.0 - 15.0 % Final  . Platelets 08/03/2021 314  140 - 400 Thousand/uL Final  . MPV 08/03/2021 9.4  7.5 - 12.5 fL Final  . Neutro Abs 08/03/2021 2,938  1,500 - 7,800 cells/uL Final  . Lymphs Abs 08/03/2021 2,372  850 - 3,900 cells/uL Final  . Absolute Monocytes 08/03/2021 437  200 - 950 cells/uL Final  . Eosinophils Absolute 08/03/2021 112  15 - 500 cells/uL Final  . Basophils Absolute 08/03/2021 41  0 - 200 cells/uL Final  . Neutrophils Relative % 08/03/2021 49.8  % Final  . Total Lymphocyte 08/03/2021 40.2  % Final  . Monocytes Relative 08/03/2021 7.4  % Final  . Eosinophils Relative 08/03/2021 1.9  % Final  . Basophils Relative 08/03/2021 0.7  %  Final  . Glucose, Bld 08/03/2021 93  65 - 99 mg/dL Final   Comment: .            Fasting reference interval .   . BUN 08/03/2021 26 (H)  7 - 25 mg/dL Final  . Creat 98/33/8250 1.01  0.60 - 1.26 mg/dL Final  . BUN/Creatinine Ratio 08/03/2021 26 (H)  6 - 22 (calc) Final  . Sodium 08/03/2021 137  135 - 146 mmol/L Final  . Potassium 08/03/2021 4.6  3.5 - 5.3 mmol/L Final  . Chloride 08/03/2021 103  98 - 110 mmol/L Final  . CO2 08/03/2021 26  20 - 32 mmol/L Final  . Calcium 08/03/2021 9.5  8.6 - 10.3 mg/dL Final  . Total Protein 08/03/2021 7.2  6.1 - 8.1 g/dL Final  . Albumin 53/97/6734 4.7  3.6 - 5.1 g/dL Final  . Globulin 19/37/9024 2.5  1.9 - 3.7 g/dL (calc) Final  . AG Ratio 08/03/2021 1.9  1.0 - 2.5 (calc) Final  . Total  Bilirubin 08/03/2021 1.0  0.2 - 1.2 mg/dL Final  . Alkaline phosphatase (APISO) 08/03/2021 63  36 - 130 U/L Final  . AST 08/03/2021 29  10 - 40 U/L Final  . ALT 08/03/2021 36  9 - 46 U/L Final       Past Medical History:  Diagnosis Date  . ADD (attention deficit disorder)   . Anxiety    takes propranolol priro to public speaking  . GERD (gastroesophageal reflux disease)   . HTN (hypertension)     Current Outpatient Medications on File Prior to Visit  Medication Sig Dispense Refill  . amphetamine-dextroamphetamine (ADDERALL XR) 10 MG 24 hr capsule Take 1 capsule (10 mg total) by mouth in the morning and at bedtime. 60 capsule 0  . propranolol (INDERAL) 20 MG tablet TAKE 1 TABLET (20 MG TOTAL) BY MOUTH 2 (TWO) TIMES DAILY. AS NEEDED FOR PUBLIC SPEAKING 180 tablet 0  . valsartan (DIOVAN) 160 MG tablet Take 1 tablet (160 mg total) by mouth daily. 90 tablet 3  . pantoprazole (PROTONIX) 40 MG tablet TAKE 1 TABLET BY MOUTH TWICE A DAY (Patient not taking: Reported on 08/13/2021) 60 tablet 2   No current facility-administered medications on file prior to visit.   No Known Allergies Social History   Socioeconomic History  . Marital status: Single    Spouse name: Not on file  . Number of children: Not on file  . Years of education: Not on file  . Highest education level: Not on file  Occupational History  . Not on file  Tobacco Use  . Smoking status: Never  . Smokeless tobacco: Never  Substance and Sexual Activity  . Alcohol use: Yes  . Drug use: No  . Sexual activity: Not on file  Other Topics Concern  . Not on file  Social History Narrative  . Not on file   Social Determinants of Health   Financial Resource Strain: Not on file  Food Insecurity: Not on file  Transportation Needs: Not on file  Physical Activity: Not on file  Stress: Not on file  Social Connections: Not on file  Intimate Partner Violence: Not on file   No family history on file.    Review of  Systems     Objective:   Physical Exam Vitals reviewed.  Constitutional:      General: He is not in acute distress.    Appearance: Normal appearance. He is normal weight. He is not ill-appearing, toxic-appearing or  diaphoretic.  HENT:     Head: Normocephalic and atraumatic.  Neck:     Vascular: No carotid bruit.  Cardiovascular:     Rate and Rhythm: Normal rate and regular rhythm.     Pulses: Normal pulses.     Heart sounds: Normal heart sounds. No murmur heard.    No friction rub. No gallop.  Pulmonary:     Effort: Pulmonary effort is normal. No respiratory distress.     Breath sounds: Normal breath sounds. No stridor. No wheezing, rhonchi or rales.  Chest:     Chest wall: No tenderness.  Abdominal:     Hernia: There is no hernia in the left inguinal area or right inguinal area.  Genitourinary:    Penis: Normal.      Testes: Normal.  Musculoskeletal:     Cervical back: Normal range of motion and neck supple. No rigidity.     Right lower leg: No edema.     Left lower leg: No edema.  Lymphadenopathy:     Cervical: No cervical adenopathy.  Neurological:     General: No focal deficit present.     Mental Status: He is alert and oriented to person, place, and time. Mental status is at baseline.          Assessment & Plan:  General medical exam Physical exam today is completely normal.  Lab work is outstanding.  We have decided to stop Adderall and see if the patient can compensate independently.  Blood pressure is well controlled on valsartan.  Regular anticipatory guidance is provided.  If patient ultimately decides to resume Adderall I will be glad to prescribe this for him again in the future.

## 2021-11-28 ENCOUNTER — Other Ambulatory Visit: Payer: Self-pay

## 2021-11-28 DIAGNOSIS — K219 Gastro-esophageal reflux disease without esophagitis: Secondary | ICD-10-CM

## 2021-11-28 MED ORDER — PANTOPRAZOLE SODIUM 40 MG PO TBEC: 40.0000 mg | DELAYED_RELEASE_TABLET | Freq: Two times a day (BID) | ORAL | 2 refills | Status: DC

## 2021-12-12 DIAGNOSIS — Z6825 Body mass index (BMI) 25.0-25.9, adult: Secondary | ICD-10-CM | POA: Diagnosis not present

## 2021-12-12 DIAGNOSIS — J0140 Acute pansinusitis, unspecified: Secondary | ICD-10-CM | POA: Diagnosis not present

## 2022-02-28 ENCOUNTER — Other Ambulatory Visit: Payer: Self-pay | Admitting: Family Medicine

## 2022-02-28 DIAGNOSIS — K219 Gastro-esophageal reflux disease without esophagitis: Secondary | ICD-10-CM

## 2022-02-28 NOTE — Telephone Encounter (Signed)
Requested Prescriptions  Pending Prescriptions Disp Refills   pantoprazole (PROTONIX) 40 MG tablet [Pharmacy Med Name: PANTOPRAZOLE SOD DR 40 MG TAB] 180 tablet 2    Sig: TAKE 1 TABLET BY MOUTH TWICE A DAY     Gastroenterology: Proton Pump Inhibitors Failed - 02/28/2022  9:30 AM      Failed - Valid encounter within last 12 months    Recent Outpatient Visits           1 year ago Benign essential HTN   [place] Susy Frizzle, MD   2 years ago Benign essential HTN   [place] Susy Frizzle, MD   2 years ago Benign essential HTN   [place] Dennard Schaumann, Cammie Mcgee, MD   2 years ago Encounter for medication review   [place], [place], FNP   2 years ago General medical exam   [place], [place], FNP

## 2022-07-22 ENCOUNTER — Other Ambulatory Visit: Payer: Self-pay | Admitting: Family Medicine

## 2022-07-22 NOTE — Telephone Encounter (Signed)
Prescription Request  07/22/2022  LOV: 08/13/2021  What is the name of the medication or equipment? valsartan (DIOVAN) 160 MG tablet   Have you contacted your pharmacy to request a refill? Yes   Which pharmacy would you like this sent to?  [id number] - [place], [place] - [address] [place]  [address] Phone: [phone] Fax: [phone]    Patient notified that their request is being sent to the clinical staff for review and that they should receive a response within 2 business days.   Please advise at [facility] [phone]

## 2022-07-23 MED ORDER — VALSARTAN 160 MG PO TABS: 160.0000 mg | ORAL_TABLET | Freq: Every day | ORAL | 0 refills | Status: AC

## 2022-07-23 NOTE — Telephone Encounter (Signed)
Patient needs OV for additional refills.  Requested Prescriptions  Pending Prescriptions Disp Refills   valsartan (DIOVAN) 160 MG tablet 90 tablet 0    Sig: Take 1 tablet (160 mg total) by mouth daily.     Cardiovascular:  Angiotensin Receptor Blockers Failed - 07/22/2022 10:12 AM      Failed - Cr in normal range and within 180 days    Creat  Date Value Ref Range Status  08/03/2021 1.01 0.60 - 1.26 mg/dL Final         Failed - K in normal range and within 180 days    Potassium  Date Value Ref Range Status  08/03/2021 4.6 3.5 - 5.3 mmol/L Final         Failed - Valid encounter within last 6 months    Recent Outpatient Visits           1 year ago Benign essential HTN   [facility] Donita Brooks, MD   2 years ago Benign essential HTN   [facility] Donita Brooks, MD   3 years ago Benign essential HTN   [facility] Tanya Nones, Priscille Heidelberg, MD   3 years ago Encounter for medication review   [facility] Elmore Guise, FNP   3 years ago General medical exam   [facility] Elmore Guise, [place]              Passed - Patient is not pregnant      Passed - Last BP in normal range    BP Readings from Last 1 Encounters:  08/13/21 116/62

## 2022-08-29 ENCOUNTER — Telehealth: Payer: Self-pay | Admitting: Family Medicine

## 2022-08-29 NOTE — Telephone Encounter (Signed)
Patient called to request for nurse to send him a script via MyChart with the list of lab tests he will need for his upcoming CPE on 10/10/22. Patient stated he will set up his MyChart account.   Please advise at [phone].

## 2022-10-10 ENCOUNTER — Encounter: Payer: Self-pay | Admitting: Family Medicine

## 2022-11-26 ENCOUNTER — Encounter: Payer: Self-pay | Admitting: Family Medicine

## 2022-12-06 DIAGNOSIS — E618 Deficiency of other specified nutrient elements: Secondary | ICD-10-CM | POA: Diagnosis not present

## 2022-12-06 DIAGNOSIS — M064 Inflammatory polyarthropathy: Secondary | ICD-10-CM | POA: Diagnosis not present

## 2022-12-06 DIAGNOSIS — E291 Testicular hypofunction: Secondary | ICD-10-CM | POA: Diagnosis not present

## 2022-12-06 DIAGNOSIS — E538 Deficiency of other specified B group vitamins: Secondary | ICD-10-CM | POA: Diagnosis not present

## 2022-12-06 DIAGNOSIS — Z13818 Encounter for screening for other digestive system disorders: Secondary | ICD-10-CM | POA: Diagnosis not present

## 2023-01-06 DIAGNOSIS — M7662 Achilles tendinitis, left leg: Secondary | ICD-10-CM | POA: Diagnosis not present

## 2023-01-06 DIAGNOSIS — M7661 Achilles tendinitis, right leg: Secondary | ICD-10-CM | POA: Diagnosis not present

## 2023-01-17 DIAGNOSIS — M7661 Achilles tendinitis, right leg: Secondary | ICD-10-CM | POA: Diagnosis not present

## 2023-01-17 DIAGNOSIS — M7662 Achilles tendinitis, left leg: Secondary | ICD-10-CM | POA: Diagnosis not present

## 2023-01-20 DIAGNOSIS — M7661 Achilles tendinitis, right leg: Secondary | ICD-10-CM | POA: Diagnosis not present

## 2023-01-20 DIAGNOSIS — M7662 Achilles tendinitis, left leg: Secondary | ICD-10-CM | POA: Diagnosis not present

## 2023-01-22 DIAGNOSIS — M7661 Achilles tendinitis, right leg: Secondary | ICD-10-CM | POA: Diagnosis not present

## 2023-01-22 DIAGNOSIS — M7662 Achilles tendinitis, left leg: Secondary | ICD-10-CM | POA: Diagnosis not present

## 2023-01-31 DIAGNOSIS — M7662 Achilles tendinitis, left leg: Secondary | ICD-10-CM | POA: Diagnosis not present

## 2023-01-31 DIAGNOSIS — M7661 Achilles tendinitis, right leg: Secondary | ICD-10-CM | POA: Diagnosis not present

## 2023-05-06 DIAGNOSIS — E538 Deficiency of other specified B group vitamins: Secondary | ICD-10-CM | POA: Diagnosis not present

## 2023-05-06 DIAGNOSIS — E60 Dietary zinc deficiency: Secondary | ICD-10-CM | POA: Diagnosis not present

## 2023-05-06 DIAGNOSIS — Z13220 Encounter for screening for lipoid disorders: Secondary | ICD-10-CM | POA: Diagnosis not present

## 2023-05-06 DIAGNOSIS — E559 Vitamin D deficiency, unspecified: Secondary | ICD-10-CM | POA: Diagnosis not present

## 2023-05-06 DIAGNOSIS — E612 Magnesium deficiency: Secondary | ICD-10-CM | POA: Diagnosis not present

## 2023-05-06 DIAGNOSIS — E291 Testicular hypofunction: Secondary | ICD-10-CM | POA: Diagnosis not present

## 2023-05-06 DIAGNOSIS — E611 Iron deficiency: Secondary | ICD-10-CM | POA: Diagnosis not present

## 2023-05-06 DIAGNOSIS — R891 Abnormal level of hormones in specimens from other organs, systems and tissues: Secondary | ICD-10-CM | POA: Diagnosis not present

## 2023-12-05 DIAGNOSIS — E612 Magnesium deficiency: Secondary | ICD-10-CM | POA: Diagnosis not present

## 2023-12-05 DIAGNOSIS — E291 Testicular hypofunction: Secondary | ICD-10-CM | POA: Diagnosis not present

## 2023-12-05 DIAGNOSIS — J160 Chlamydial pneumonia: Secondary | ICD-10-CM | POA: Diagnosis not present

## 2023-12-05 DIAGNOSIS — B960 Mycoplasma pneumoniae [M. pneumoniae] as the cause of diseases classified elsewhere: Secondary | ICD-10-CM | POA: Diagnosis not present

## 2023-12-05 DIAGNOSIS — E559 Vitamin D deficiency, unspecified: Secondary | ICD-10-CM | POA: Diagnosis not present

## 2023-12-05 DIAGNOSIS — Z13220 Encounter for screening for lipoid disorders: Secondary | ICD-10-CM | POA: Diagnosis not present

## 2023-12-05 DIAGNOSIS — E538 Deficiency of other specified B group vitamins: Secondary | ICD-10-CM | POA: Diagnosis not present

## 2023-12-05 DIAGNOSIS — R891 Abnormal level of hormones in specimens from other organs, systems and tissues: Secondary | ICD-10-CM | POA: Diagnosis not present

## 2023-12-05 DIAGNOSIS — E60 Dietary zinc deficiency: Secondary | ICD-10-CM | POA: Diagnosis not present

## 2023-12-05 DIAGNOSIS — E611 Iron deficiency: Secondary | ICD-10-CM | POA: Diagnosis not present
